# Patient Record
Sex: Female | Born: 2000 | Race: Black or African American | Hispanic: No | Marital: Single | Smoking: Never smoker
Health system: Southern US, Community
[De-identification: ages and names within clinical notes are randomized; demographics above are authoritative.]

## PROBLEM LIST (undated history)

## (undated) DIAGNOSIS — J45909 Unspecified asthma, uncomplicated: Secondary | ICD-10-CM

## (undated) DIAGNOSIS — E039 Hypothyroidism, unspecified: Secondary | ICD-10-CM

## (undated) HISTORY — PX: NO PAST SURGERIES: SHX2092

---
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
## 2018-06-16 ENCOUNTER — Other Ambulatory Visit: Payer: Self-pay

## 2018-06-16 ENCOUNTER — Emergency Department (HOSPITAL_BASED_OUTPATIENT_CLINIC_OR_DEPARTMENT_OTHER)
Admission: EM | Admit: 2018-06-16 | Discharge: 2018-06-16 | Disposition: A | Discharge status: Home / Self Care | Payer: Medicaid Other | Attending: Emergency Medicine | Admitting: Emergency Medicine

## 2018-06-16 ENCOUNTER — Encounter (HOSPITAL_BASED_OUTPATIENT_CLINIC_OR_DEPARTMENT_OTHER): Payer: Self-pay

## 2018-06-16 DIAGNOSIS — N3001 Acute cystitis with hematuria: Secondary | ICD-10-CM | POA: Insufficient documentation

## 2018-06-16 LAB — URINALYSIS, ROUTINE W REFLEX MICROSCOPIC
Bilirubin Urine: NEGATIVE
GLUCOSE, UA: NEGATIVE mg/dL
Ketones, ur: NEGATIVE mg/dL
Nitrite: NEGATIVE
PH: 6.5 (ref 5.0–8.0)
Protein, ur: NEGATIVE mg/dL
Specific Gravity, Urine: 1.015 (ref 1.005–1.030)

## 2018-06-16 LAB — URINALYSIS, MICROSCOPIC (REFLEX)

## 2018-06-16 LAB — PREGNANCY, URINE: Preg Test, Ur: NEGATIVE

## 2018-06-16 MED ORDER — CEPHALEXIN 500 MG PO CAPS: 500.0000 mg | ORAL_CAPSULE | Freq: Three times a day (TID) | ORAL | 0 refills | Status: AC

## 2018-06-16 MED ORDER — CEPHALEXIN 250 MG PO CAPS
500.0000 mg | ORAL_CAPSULE | Freq: Once | ORAL | Status: AC
  Administered 2018-06-16: 500 mg via ORAL
  Filled 2018-06-16: qty 2

## 2018-06-16 NOTE — ED Triage Notes (Addendum)
C/o urinary freq x 6 days-NAD-steady gait

## 2018-06-16 NOTE — ED Notes (Signed)
Increased urinary frequency,  Discomfort w urination  X week

## 2018-06-16 NOTE — Discharge Instructions (Signed)
You can take Tylenol or Ibuprofen as directed for pain. You can alternate Tylenol and Ibuprofen every 4 hours. If you take Tylenol at 1pm, then you can take Ibuprofen at 5pm. Then you can take Tylenol again at 9pm.   Take antibiotics as directed. Please take all of your antibiotics until finished.  Follow-up with [facility].  Return to the emergency department for any worsening pain, fever, nausea or vomiting or any other worsening concerning symptoms.

## 2018-06-16 NOTE — ED Provider Notes (Signed)
[facility] EMERGENCY DEPARTMENT Provider Note   CSN: [id number] Arrival date & time: 06/16/18  2001     History   Chief Complaint Chief Complaint  Patient presents with  . Urinary Frequency    HPI Regina Carr is a 17 y.o. female who presents for evaluation of increased urinary frequency, urinary discomfort x1 week.  Patient reports that she has noticed relax week, she would have frequent urination.  She states that when she was finished urinating, she did have some discomfort and irritation.  She has not noted any blood in the urine.  Patient states that she has been able to tolerate p.o. with any difficulty denies any nausea/vomiting.  Patient denies any fevers, vaginal bleeding, vaginal discharge, lower back pain, abdominal pain.  The history is provided by the patient.    History reviewed. No pertinent past medical history.  There are no active problems to display for this patient.   History reviewed. No pertinent surgical history.   OB History   None      Home Medications    Prior to Admission medications   Medication Sig Start Date End Date Taking? Authorizing Provider  cephALEXin (KEFLEX) 500 MG capsule Take 1 capsule (500 mg total) by mouth 3 (three) times daily for 7 days. 06/16/18 06/23/18  Maxwell Caul, PA-C  naproxen (NAPROSYN) 375 MG tablet Take 1 tablet twice daily with food as needed for chest wall pain. 10/27/17   Molpus, John, MD    Family History No family history on file.  Social History Social History   Tobacco Use  . Smoking status: Never Smoker  . Smokeless tobacco: Never Used  Substance Use Topics  . Alcohol use: Never    Frequency: Never  . Drug use: Never     Allergies   Patient has no known allergies.   Review of Systems Review of Systems  Constitutional: Negative for fever.  Respiratory: Negative for cough and shortness of breath.   Cardiovascular: Negative for chest pain.  Gastrointestinal: Negative  for abdominal pain, nausea and vomiting.  Genitourinary: Positive for dysuria and frequency. Negative for hematuria, vaginal bleeding and vaginal discharge.  Neurological: Negative for headaches.  All other systems reviewed and are negative.    Physical Exam Updated Vital Signs BP 119/78 (BP Location: Right Arm)   Pulse 93   Temp 99 F (37.2 C) (Oral)   Resp 18   Ht 5' (1.524 m)   Wt 77.1 kg   SpO2 100%   BMI 33.20 kg/m   Physical Exam  Constitutional: She appears well-developed and well-nourished.  HENT:  Head: Normocephalic and atraumatic.  Eyes: Conjunctivae and EOM are normal. Right eye exhibits no discharge. Left eye exhibits no discharge. No scleral icterus.  Pulmonary/Chest: Effort normal and breath sounds normal.  Abdominal: Normal appearance and bowel sounds are normal. There is no tenderness. There is no rigidity, no guarding and no CVA tenderness.  Abdomen is soft, non-distended, non-tender. No rigidity, No guarding. No peritoneal signs.  No CVA tenderness bilaterally.  Neurological: She is alert.  Skin: Skin is warm and dry.  Psychiatric: She has a normal mood and affect. Her speech is normal and behavior is normal.  Nursing note and vitals reviewed.    ED Treatments / Results  Labs (all labs ordered are listed, but only abnormal results are displayed) Labs Reviewed  URINALYSIS, ROUTINE W REFLEX MICROSCOPIC - Abnormal; Notable for the following components:      Result Value   APPearance CLOUDY (*)  Hgb urine dipstick TRACE (*)    Leukocytes, UA LARGE (*)    All other components within normal limits  URINALYSIS, MICROSCOPIC (REFLEX) - Abnormal; Notable for the following components:   Bacteria, UA FEW (*)    All other components within normal limits  URINE CULTURE  PREGNANCY, URINE    EKG None  Radiology No results found.  Procedures Procedures (including critical care time)  Medications Ordered in ED Medications  cephALEXin (KEFLEX) capsule  500 mg (500 mg Oral Given 06/16/18 2222)     Initial Impression / Assessment and Plan / ED Course  I have reviewed the triage vital signs and the nursing notes.  Pertinent labs & imaging results that were available during my care of the patient were reviewed by me and considered in my medical decision making (see chart for details).     17 year old female who presents for evaluation of 1 week of increased urinary frequency, dysuria.  No fevers, nausea/vomiting, abdominal pain, lower back pain. Patient is afebrile, non-toxic appearing, sitting comfortably on examination table. Vital signs reviewed and stable.  On exam, abdomen is soft, nontender.  No CVA tenderness.  Consider UTI.  History/physical exam is not concerning for pyelonephritis, kidney stone.  Urine ordered at triage.  Urine pregnancy is negative.  UA shows trace hemoglobin, large leukocytes, pyuria.  There is squamous epithelium so question if this is contaminant.  Urine culture sent.  Given that patient is symptomatic, we will plan to start her on antibiotics. Patient with no known drug allergies. Patient had ample opportunity for questions and discussion. All patient's questions were answered with full understanding. Strict return precautions discussed. Patient expresses understanding and agreement to plan.   Final Clinical Impressions(s) / ED Diagnoses   Final diagnoses:  Acute cystitis with hematuria    ED Discharge Orders         Ordered    cephALEXin (KEFLEX) 500 MG capsule  3 times daily     06/16/18 2212           Maxwell Caul, PA-C 06/16/18 2230    Virgina Norfolk, DO 06/17/18 0109

## 2018-06-19 LAB — URINE CULTURE
Culture: >=100000 — AB
Special Requests: NORMAL

## 2018-06-20 ENCOUNTER — Telehealth: Payer: Self-pay

## 2018-06-20 NOTE — Telephone Encounter (Signed)
Post ED Visit - Positive Culture Follow-up  Culture report reviewed by antimicrobial stewardship pharmacist:  []  Enzo Bi, Pharm.D. []  Celedonio Miyamoto, Pharm.D., BCPS AQ-ID [x]  Garvin Fila, Pharm.D., BCPS []  Georgina Pillion, [address].D., BCPS []  [place], [address].D., BCPS, AAHIVP []  Estella Husk, Pharm.D., BCPS, AAHIVP []  Lysle Pearl, PharmD, BCPS []  Phillips Climes, PharmD, BCPS []  Agapito Games, PharmD, BCPS []  Verlan Friends, PharmD  Positive urine culture Treated with Cephalexin, organism sensitive to the same and no further patient follow-up is required at this time.  Jerry Caras 06/20/2018, 10:18 AM

## 2018-11-13 ENCOUNTER — Emergency Department (HOSPITAL_BASED_OUTPATIENT_CLINIC_OR_DEPARTMENT_OTHER)
Admission: EM | Admit: 2018-11-13 | Discharge: 2018-11-13 | Disposition: A | Discharge status: Home / Self Care | Payer: Medicaid Other | Attending: Emergency Medicine | Admitting: Emergency Medicine

## 2018-11-13 ENCOUNTER — Other Ambulatory Visit: Payer: Self-pay

## 2018-11-13 ENCOUNTER — Encounter (HOSPITAL_BASED_OUTPATIENT_CLINIC_OR_DEPARTMENT_OTHER): Payer: Self-pay | Admitting: Adult Health

## 2018-11-13 DIAGNOSIS — N939 Abnormal uterine and vaginal bleeding, unspecified: Secondary | ICD-10-CM

## 2018-11-13 DIAGNOSIS — R102 Pelvic and perineal pain: Secondary | ICD-10-CM | POA: Insufficient documentation

## 2018-11-13 LAB — URINALYSIS, ROUTINE W REFLEX MICROSCOPIC
Bilirubin Urine: NEGATIVE
Glucose, UA: NEGATIVE mg/dL
Ketones, ur: NEGATIVE mg/dL
Nitrite: NEGATIVE
PROTEIN: 30 mg/dL — AB
SPECIFIC GRAVITY, URINE: 1.02 (ref 1.005–1.030)
pH: 7 (ref 5.0–8.0)

## 2018-11-13 LAB — WET PREP, GENITAL
Sperm: NONE SEEN
Trich, Wet Prep: NONE SEEN
Yeast Wet Prep HPF POC: NONE SEEN

## 2018-11-13 LAB — COMPREHENSIVE METABOLIC PANEL
ALT: 16 U/L (ref 0–44)
AST: 20 U/L (ref 15–41)
Albumin: 4.1 g/dL (ref 3.5–5.0)
Alkaline Phosphatase: 132 U/L — ABNORMAL HIGH (ref 38–126)
Anion gap: 6 (ref 5–15)
BUN: 13 mg/dL (ref 6–20)
CO2: 25 mmol/L (ref 22–32)
Calcium: 9.3 mg/dL (ref 8.9–10.3)
Chloride: 104 mmol/L (ref 98–111)
Creatinine, Ser: 0.68 mg/dL (ref 0.44–1.00)
GFR calc Af Amer: >60 mL/min (ref >60)
GFR calc non Af Amer: >60 mL/min (ref >60)
Glucose, Bld: 93 mg/dL (ref 70–99)
Potassium: 3.8 mmol/L (ref 3.5–5.1)
Sodium: 135 mmol/L (ref 135–145)
Total Bilirubin: 0.6 mg/dL (ref 0.3–1.2)
Total Protein: 7.9 g/dL (ref 6.5–8.1)

## 2018-11-13 LAB — CBC WITH DIFFERENTIAL/PLATELET
Abs Immature Granulocytes: 0.02 10*3/uL (ref 0.00–0.07)
Basophils Absolute: 0 10*3/uL (ref 0.0–0.1)
Basophils Relative: 0 %
Eosinophils Absolute: 0.1 10*3/uL (ref 0.0–0.5)
Eosinophils Relative: 1 %
HCT: 38.8 % (ref 36.0–46.0)
Hemoglobin: 11.9 g/dL — ABNORMAL LOW (ref 12.0–15.0)
Immature Granulocytes: 0 %
Lymphocytes Relative: 35 %
Lymphs Abs: 2.1 10*3/uL (ref 0.7–4.0)
MCH: 25.4 pg — ABNORMAL LOW (ref 26.0–34.0)
MCHC: 30.7 g/dL (ref 30.0–36.0)
MCV: 82.7 fL (ref 80.0–100.0)
Monocytes Absolute: 0.8 10*3/uL (ref 0.1–1.0)
Monocytes Relative: 13 %
Neutro Abs: 3.1 10*3/uL (ref 1.7–7.7)
Neutrophils Relative %: 51 %
Platelets: 394 10*3/uL (ref 150–400)
RBC: 4.69 MIL/uL (ref 3.87–5.11)
RDW: 15.2 % (ref 11.5–15.5)
WBC: 6.1 10*3/uL (ref 4.0–10.5)
nRBC: 0 % (ref 0.0–0.2)

## 2018-11-13 LAB — URINALYSIS, MICROSCOPIC (REFLEX)

## 2018-11-13 LAB — PREGNANCY, URINE: PREG TEST UR: NEGATIVE

## 2018-11-13 MED ORDER — METRONIDAZOLE 500 MG PO TABS: 500.0000 mg | ORAL_TABLET | Freq: Two times a day (BID) | ORAL | 0 refills | Status: DC

## 2018-11-13 NOTE — ED Triage Notes (Signed)
Presents with vaginal bleeding that began two weeks ago, she has been going through 15 pads a day with large clots for the past week. She denies SOB, dizziness.

## 2018-11-13 NOTE — ED Provider Notes (Signed)
[facility] EMERGENCY DEPARTMENT Provider Note   CSN: [id number] Arrival date & time: 11/13/18  1047    History   Chief Complaint Chief Complaint  Patient presents with  . Vaginal Bleeding    HPI Regina Carr is a 18 y.o. female who is previously healthy who presents with a 2-week history of vaginal bleeding and intermittent pelvic cramping.  Patient reports she spotted a week or so before her normal.  And then began having bleeding that is persisted for 2 weeks.  She is feeling 15 pads daily.  She denies any new medications.  She is not on any OCP or hormonal birth control.  She has never had bleeding like this before, although does state that she does have regular periods.  She denies any significant abdominal pain.  She reports she vomited once in melanite last week, but has not since.  She denies any urinary symptoms, chest pain, shortness of breath, lightheadedness, dizziness.  She does not have an OB/GYN.     HPI  History reviewed. No pertinent past medical history.  There are no active problems to display for this patient.   History reviewed. No pertinent surgical history.   OB History   No obstetric history on file.      Home Medications    Prior to Admission medications   Medication Sig Start Date End Date Taking? Authorizing Provider  metroNIDAZOLE (FLAGYL) 500 MG tablet Take 1 tablet (500 mg total) by mouth 2 (two) times daily. 11/13/18   Fergie Sherbert, Waylan Boga, PA-C  naproxen (NAPROSYN) 375 MG tablet Take 1 tablet twice daily with food as needed for chest wall pain. 10/27/17   Molpus, Jonny Ruiz, MD    Family History History reviewed. No pertinent family history.  Social History Social History   Tobacco Use  . Smoking status: Never Smoker  . Smokeless tobacco: Never Used  Substance Use Topics  . Alcohol use: Never    Frequency: Never  . Drug use: Never     Allergies   Patient has no known allergies.   Review of Systems Review of Systems   Constitutional: Negative for chills and fever.  HENT: Negative for facial swelling and sore throat.   Respiratory: Negative for shortness of breath.   Cardiovascular: Negative for chest pain.  Gastrointestinal: Positive for vomiting (x1). Negative for abdominal pain and nausea.  Genitourinary: Positive for vaginal bleeding. Negative for dysuria.  Musculoskeletal: Negative for back pain.  Skin: Negative for rash and wound.  Neurological: Negative for headaches.  Psychiatric/Behavioral: The patient is not nervous/anxious.      Physical Exam Updated Vital Signs BP 129/85   Pulse 99   Temp 98.2 F (36.8 C) (Oral)   Resp 18   Ht 5\' 2"  (1.575 m)   Wt 90.7 kg   SpO2 100%   BMI 36.58 kg/m   Physical Exam Vitals signs and nursing note reviewed. Exam conducted with a chaperone present.  Constitutional:      General: She is not in acute distress.    Appearance: She is well-developed. She is not diaphoretic.  HENT:     Head: Normocephalic and atraumatic.     Mouth/Throat:     Pharynx: No oropharyngeal exudate.  Eyes:     General: No scleral icterus.       Right eye: No discharge.        Left eye: No discharge.     Conjunctiva/sclera: Conjunctivae normal.     Pupils: Pupils are equal, round, and reactive  to light.  Neck:     Musculoskeletal: Normal range of motion and neck supple.     Thyroid: No thyromegaly.  Cardiovascular:     Rate and Rhythm: Normal rate and regular rhythm.     Heart sounds: Normal heart sounds. No murmur. No friction rub. No gallop.   Pulmonary:     Effort: Pulmonary effort is normal. No respiratory distress.     Breath sounds: Normal breath sounds. No stridor. No wheezing or rales.  Abdominal:     General: Bowel sounds are normal. There is no distension.     Palpations: Abdomen is soft.     Tenderness: There is no abdominal tenderness. There is no guarding or rebound.  Genitourinary:    Vagina: Bleeding present.     Cervix: Cervical motion tenderness  (very mild) and cervical bleeding present.     Uterus: Normal.      Adnexa:        Right: No mass or tenderness.         Left: No mass or tenderness.    Lymphadenopathy:     Cervical: No cervical adenopathy.  Skin:    General: Skin is warm and dry.     Coloration: Skin is not pale.     Findings: No rash.  Neurological:     Mental Status: She is alert.     Coordination: Coordination normal.      ED Treatments / Results  Labs (all labs ordered are listed, but only abnormal results are displayed) Labs Reviewed  WET PREP, GENITAL - Abnormal; Notable for the following components:      Result Value   Clue Cells Wet Prep HPF POC PRESENT (*)    WBC, Wet Prep HPF POC FEW (*)    All other components within normal limits  URINALYSIS, ROUTINE W REFLEX MICROSCOPIC - Abnormal; Notable for the following components:   APPearance CLOUDY (*)    Hgb urine dipstick LARGE (*)    Protein, ur 30 (*)    Leukocytes,Ua TRACE (*)    All other components within normal limits  URINALYSIS, MICROSCOPIC (REFLEX) - Abnormal; Notable for the following components:   Bacteria, UA RARE (*)    All other components within normal limits  COMPREHENSIVE METABOLIC PANEL - Abnormal; Notable for the following components:   Alkaline Phosphatase 132 (*)    All other components within normal limits  CBC WITH DIFFERENTIAL/PLATELET - Abnormal; Notable for the following components:   Hemoglobin 11.9 (*)    MCH 25.4 (*)    All other components within normal limits  PREGNANCY, URINE  GC/CHLAMYDIA PROBE AMP ([place]) NOT AT [facility]    EKG None  Radiology No results found.  Procedures Procedures (including critical care time)  Medications Ordered in ED Medications - No data to display   Initial Impression / Assessment and Plan / ED Course  I have reviewed the triage vital signs and the nursing notes.  Pertinent labs & imaging results that were available during my care of the patient were reviewed by me and  considered in my medical decision making (see chart for details).        Patient presenting with 2-week history of vaginal bleeding.  Hemoglobin is 11.9.  Patient has no hemorrhage on pelvic exam.  She has no significant pain.  I discussed patient case with OB/GYN on-call, Dr. Earlene Plater, who did not advise beginning any hormonal treatment at this time.  Patient is advised to follow-up in the office.  Patient  does have clue cells on wet prep and she is given Flagyl.  GC/chlamydia sent and pending.  Return precautions discussed.  Patient understands and agrees with plan.  Patient vitals stable throughout ED course and discharged in satisfactory condition. I discussed patient case with Dr. Denton Lank who guided the patient's management and agrees with plan.   Final Clinical Impressions(s) / ED Diagnoses   Final diagnoses:  Abnormal uterine bleeding    ED Discharge Orders         Ordered    metroNIDAZOLE (FLAGYL) 500 MG tablet  2 times daily     11/13/18 [address] [place], [place] 11/13/18 1418    Cathren Laine, MD 11/13/18 1442

## 2018-11-13 NOTE — ED Notes (Signed)
Pt verbalized understanding of dc instructions.

## 2018-11-13 NOTE — Discharge Instructions (Addendum)
Please follow-up with the women's outpatient clinic or another OB/GYN of your choice for further evaluation and treatment of your vaginal bleeding.  Please return to the emergency department if you develop any new or worsening symptoms including severe lightheadedness or passing out, chest pain, shortness of breath, severe abdominal pain, or any other new or concerning symptoms.

## 2018-11-15 LAB — GC/CHLAMYDIA PROBE AMP (~~LOC~~) NOT AT ARMC
Chlamydia: NEGATIVE
Neisseria Gonorrhea: NEGATIVE

## 2018-11-25 ENCOUNTER — Other Ambulatory Visit: Payer: Self-pay

## 2018-11-25 ENCOUNTER — Ambulatory Visit (INDEPENDENT_AMBULATORY_CARE_PROVIDER_SITE_OTHER): Payer: Medicaid Other | Admitting: Family Medicine

## 2018-11-25 DIAGNOSIS — N970 Female infertility associated with anovulation: Secondary | ICD-10-CM

## 2018-11-25 MED ORDER — NORGESTIMATE-ETH ESTRADIOL 0.25-35 MG-MCG PO TABS: 1.0000 | ORAL_TABLET | Freq: Every day | ORAL | 3 refills | Status: DC

## 2018-11-25 NOTE — Progress Notes (Signed)
TELEHEALTH VIRTUAL GYNECOLOGY VISIT ENCOUNTER NOTE  I connected with Regina Carr on 11/25/18 at  1:15 PM EDT by telephone at home and verified that I am speaking with the correct person using two identifiers.   I discussed the limitations, risks, security and privacy concerns of performing an evaluation and management service by telephone and the availability of in person appointments. I also discussed with the patient that there may be a patient responsible charge related to this service. The patient expressed understanding and agreed to proceed.   History:  Regina Carr is a 18 y.o. G0 female being evaluated today for abnormal uterine bleeding.  Patient was seen in the emergency department for 2 weeks of bleeding.  At that time her hemoglobin was 11.9.  The ED course and labs were all reviewed at today's visit. She denies any abnormal vaginal discharge, pelvic pain or other concerns.  After the ED visit, the patient has continued to have bleeding without any stop.  She uses 8-10 pads per day that are saturated.  No new sexual partners.  Is not currently sexually active.  Did have some irregular periods when first started menses, which improved after being on birth control pills.  Has regular interval periods that traditionally last about 4 days.     No past medical history on file.  Otherwise healthy No past surgical history on file.  No history of surgery  The following portions of the patient's history were reviewed and updated as appropriate: allergies, current medications, past family history, past medical history, past social history, past surgical history and problem list.  Does not smoke  Health Maintenance: No Pap or mammogram indicated  Review of Systems:  Pertinent items noted in HPI and remainder of comprehensive ROS otherwise negative.  Physical Exam:  Physical exam deferred due to nature of the encounter  Labs and Imaging Results for orders placed or performed  during the hospital encounter of 11/13/18 (from the past 336 hour(s))  GC/Chlamydia probe amp   Collection Time: 11/13/18 12:00 AM  Result Value Ref Range   Chlamydia Negative    Neisseria gonorrhea Negative   Pregnancy, urine   Collection Time: 11/13/18 11:14 AM  Result Value Ref Range   Preg Test, Ur NEGATIVE NEGATIVE  Urinalysis, Routine w reflex microscopic   Collection Time: 11/13/18 11:14 AM  Result Value Ref Range   Color, Urine YELLOW YELLOW   APPearance CLOUDY (A) CLEAR   Specific Gravity, Urine 1.020 1.005 - 1.030   pH 7.0 5.0 - 8.0   Glucose, UA NEGATIVE NEGATIVE mg/dL   Hgb urine dipstick LARGE (A) NEGATIVE   Bilirubin Urine NEGATIVE NEGATIVE   Ketones, ur NEGATIVE NEGATIVE mg/dL   Protein, ur 30 (A) NEGATIVE mg/dL   Nitrite NEGATIVE NEGATIVE   Leukocytes,Ua TRACE (A) NEGATIVE  Urinalysis, Microscopic (reflex)   Collection Time: 11/13/18 11:14 AM  Result Value Ref Range   RBC / HPF >50 0 - 5 RBC/hpf   WBC, UA 0-5 0 - 5 WBC/hpf   Bacteria, UA RARE (A) NONE SEEN   Squamous Epithelial / LPF 0-5 0 - 5  Comprehensive metabolic panel   Collection Time: 11/13/18 11:51 AM  Result Value Ref Range   Sodium 135 135 - 145 mmol/L   Potassium 3.8 3.5 - 5.1 mmol/L   Chloride 104 98 - 111 mmol/L   CO2 25 22 - 32 mmol/L   Glucose, Bld 93 70 - 99 mg/dL   BUN 13 6 - 20 mg/dL  Creatinine, Ser 0.68 0.44 - 1.00 mg/dL   Calcium 9.3 8.9 - 30.0 mg/dL   Total Protein 7.9 6.5 - 8.1 g/dL   Albumin 4.1 3.5 - 5.0 g/dL   AST 20 15 - 41 U/L   ALT 16 0 - 44 U/L   Alkaline Phosphatase 132 (H) 38 - 126 U/L   Total Bilirubin 0.6 0.3 - 1.2 mg/dL   GFR calc non Af Amer >60 >60 mL/min   GFR calc Af Amer >60 >60 mL/min   Anion gap 6 5 - 15  CBC with Differential   Collection Time: 11/13/18 11:51 AM  Result Value Ref Range   WBC 6.1 4.0 - 10.5 K/uL   RBC 4.69 3.87 - 5.11 MIL/uL   Hemoglobin 11.9 (L) 12.0 - 15.0 g/dL   HCT 92.3 30.0 - 76.2 %   MCV 82.7 80.0 - 100.0 fL   MCH 25.4 (L)  26.0 - 34.0 pg   MCHC 30.7 30.0 - 36.0 g/dL   RDW 26.3 33.5 - 45.6 %   Platelets 394 150 - 400 K/uL   nRBC 0.0 0.0 - 0.2 %   Neutrophils Relative % 51 %   Neutro Abs 3.1 1.7 - 7.7 K/uL   Lymphocytes Relative 35 %   Lymphs Abs 2.1 0.7 - 4.0 K/uL   Monocytes Relative 13 %   Monocytes Absolute 0.8 0.1 - 1.0 K/uL   Eosinophils Relative 1 %   Eosinophils Absolute 0.1 0.0 - 0.5 K/uL   Basophils Relative 0 %   Basophils Absolute 0.0 0.0 - 0.1 K/uL   Immature Granulocytes 0 %   Abs Immature Granulocytes 0.02 0.00 - 0.07 K/uL  Wet prep, genital   Collection Time: 11/13/18 12:04 PM  Result Value Ref Range   Yeast Wet Prep HPF POC NONE SEEN NONE SEEN   Trich, Wet Prep NONE SEEN NONE SEEN   Clue Cells Wet Prep HPF POC PRESENT (A) NONE SEEN   WBC, Wet Prep HPF POC FEW (A) NONE SEEN   Sperm NONE SEEN    No results found.    Assessment and Plan:     1. Anovulatory bleeding OCP taper.  Gust how to use and possibility of becoming nauseated on higher doses of estrogen.  Instructed patient to call with any questions or concerns.  Patient does have access to my chart and instructions for OCP taper was sent to the patient.  We will follow-up with the patient via phone next week and schedule a follow-up in 3-4 months.       I discussed the assessment and treatment plan with the patient. The patient was provided an opportunity to ask questions and all were answered. The patient agreed with the plan and demonstrated an understanding of the instructions.   The patient was advised to call back or seek an in-person evaluation/go to the ED if the symptoms worsen or if the condition fails to improve as anticipated.  I provided 15 minutes of non-face-to-face time during this encounter.   Levie Heritage, DO [facility] [employer], [facility]

## 2018-12-14 MED ORDER — MEGESTROL ACETATE 40 MG PO TABS: 40.0000 mg | ORAL_TABLET | Freq: Two times a day (BID) | ORAL | 0 refills | Status: DC

## 2019-01-04 ENCOUNTER — Encounter (HOSPITAL_BASED_OUTPATIENT_CLINIC_OR_DEPARTMENT_OTHER): Payer: Self-pay | Admitting: Emergency Medicine

## 2019-01-04 ENCOUNTER — Emergency Department (HOSPITAL_BASED_OUTPATIENT_CLINIC_OR_DEPARTMENT_OTHER): Payer: No Typology Code available for payment source

## 2019-01-04 ENCOUNTER — Emergency Department (HOSPITAL_BASED_OUTPATIENT_CLINIC_OR_DEPARTMENT_OTHER)
Admission: EM | Admit: 2019-01-04 | Discharge: 2019-01-05 | Disposition: A | Discharge status: Home / Self Care | Payer: No Typology Code available for payment source | Attending: Emergency Medicine | Admitting: Emergency Medicine

## 2019-01-04 ENCOUNTER — Other Ambulatory Visit: Payer: Self-pay

## 2019-01-04 DIAGNOSIS — M545 Low back pain: Secondary | ICD-10-CM | POA: Diagnosis not present

## 2019-01-04 DIAGNOSIS — Z79899 Other long term (current) drug therapy: Secondary | ICD-10-CM | POA: Insufficient documentation

## 2019-01-04 DIAGNOSIS — M25531 Pain in right wrist: Secondary | ICD-10-CM | POA: Diagnosis not present

## 2019-01-04 DIAGNOSIS — J45909 Unspecified asthma, uncomplicated: Secondary | ICD-10-CM | POA: Insufficient documentation

## 2019-01-04 DIAGNOSIS — Y999 Unspecified external cause status: Secondary | ICD-10-CM | POA: Insufficient documentation

## 2019-01-04 DIAGNOSIS — Y939 Activity, unspecified: Secondary | ICD-10-CM | POA: Insufficient documentation

## 2019-01-04 DIAGNOSIS — Y92410 Unspecified street and highway as the place of occurrence of the external cause: Secondary | ICD-10-CM | POA: Diagnosis not present

## 2019-01-04 HISTORY — DX: Unspecified asthma, uncomplicated: J45.909

## 2019-01-04 MED ORDER — IBUPROFEN 600 MG PO TABS: 600.0000 mg | ORAL_TABLET | Freq: Four times a day (QID) | ORAL | 0 refills | Status: DC | PRN

## 2019-01-04 MED ORDER — IBUPROFEN 800 MG PO TABS
800.0000 mg | ORAL_TABLET | Freq: Once | ORAL | Status: AC
  Administered 2019-01-04: 800 mg via ORAL
  Filled 2019-01-04: qty 1
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
## 2019-01-04 NOTE — Discharge Instructions (Addendum)
Your Xrays are negative. As we discussed, a fracture may not appear on Xray right away. You should wear the splint and followup with the hand doctor. Use ice, motrin, elevation. Return to the ED with new or worsening symptoms.

## 2019-01-11 MED ORDER — MEGESTROL ACETATE 40 MG PO TABS: 40.0000 mg | ORAL_TABLET | Freq: Two times a day (BID) | ORAL | 0 refills | Status: AC

## 2019-02-24 ENCOUNTER — Other Ambulatory Visit: Payer: Self-pay

## 2019-02-24 ENCOUNTER — Ambulatory Visit: Payer: Medicaid Other | Admitting: Family Medicine

## 2019-03-26 ENCOUNTER — Encounter (HOSPITAL_COMMUNITY): Payer: Self-pay | Admitting: Emergency Medicine

## 2019-03-26 ENCOUNTER — Emergency Department (HOSPITAL_COMMUNITY)
Admission: EM | Admit: 2019-03-26 | Discharge: 2019-03-26 | Disposition: A | Discharge status: Home / Self Care | Payer: Medicaid Other | Attending: Emergency Medicine | Admitting: Emergency Medicine

## 2019-03-26 DIAGNOSIS — Y939 Activity, unspecified: Secondary | ICD-10-CM | POA: Insufficient documentation

## 2019-03-26 DIAGNOSIS — W01110A Fall on same level from slipping, tripping and stumbling with subsequent striking against sharp glass, initial encounter: Secondary | ICD-10-CM | POA: Insufficient documentation

## 2019-03-26 DIAGNOSIS — S51822A Laceration with foreign body of left forearm, initial encounter: Secondary | ICD-10-CM | POA: Insufficient documentation

## 2019-03-26 DIAGNOSIS — S41112A Laceration without foreign body of left upper arm, initial encounter: Secondary | ICD-10-CM

## 2019-03-26 DIAGNOSIS — Y999 Unspecified external cause status: Secondary | ICD-10-CM | POA: Insufficient documentation

## 2019-03-26 DIAGNOSIS — Y929 Unspecified place or not applicable: Secondary | ICD-10-CM | POA: Insufficient documentation

## 2019-03-26 DIAGNOSIS — J45909 Unspecified asthma, uncomplicated: Secondary | ICD-10-CM | POA: Insufficient documentation
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
## 2019-03-26 NOTE — Discharge Instructions (Signed)
Return for redness, drainage, fever.    

## 2019-04-07 ENCOUNTER — Emergency Department (HOSPITAL_BASED_OUTPATIENT_CLINIC_OR_DEPARTMENT_OTHER)
Admission: EM | Admit: 2019-04-07 | Discharge: 2019-04-07 | Disposition: A | Discharge status: Home / Self Care | Payer: Self-pay | Attending: Emergency Medicine | Admitting: Emergency Medicine

## 2019-04-07 ENCOUNTER — Encounter (HOSPITAL_BASED_OUTPATIENT_CLINIC_OR_DEPARTMENT_OTHER): Payer: Self-pay | Admitting: *Deleted

## 2019-04-07 ENCOUNTER — Other Ambulatory Visit: Payer: Self-pay

## 2019-04-07 DIAGNOSIS — Z4802 Encounter for removal of sutures: Secondary | ICD-10-CM | POA: Insufficient documentation

## 2019-04-07 NOTE — ED Triage Notes (Signed)
Here for suture removal from her left arm. Well healed.
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
## 2019-04-07 NOTE — Discharge Instructions (Addendum)
You were evaluated in the Emergency Department and after careful evaluation, we did not find any emergent condition requiring admission or further testing in the hospital.  Your wounds are healing well and we removed her sutures today.  We recommend Neosporin to aid with scarring.  Please return to the Emergency Department if you experience any worsening of your condition.  We encourage you to follow up with a primary care provider.  Thank you for allowing [place] to be a part of your care.

## 2019-04-07 NOTE — ED Notes (Signed)
Pt here for suture removal, intact wounds , no bleeding nor signs of infection.

## 2019-10-06 ENCOUNTER — Ambulatory Visit: Payer: Medicaid Other | Admitting: Family Medicine

## 2019-10-27 ENCOUNTER — Other Ambulatory Visit: Payer: Self-pay

## 2019-10-27 ENCOUNTER — Encounter: Payer: Self-pay | Admitting: Family Medicine

## 2019-10-27 ENCOUNTER — Ambulatory Visit (INDEPENDENT_AMBULATORY_CARE_PROVIDER_SITE_OTHER): Payer: Self-pay | Admitting: Family Medicine

## 2019-10-27 VITALS — BP 120/76 | HR 89 | Ht 61.0 in | Wt 180.0 lb

## 2019-10-27 DIAGNOSIS — N939 Abnormal uterine and vaginal bleeding, unspecified: Secondary | ICD-10-CM

## 2019-10-27 MED ORDER — NORETHIN ACE-ETH ESTRAD-FE 1-20 MG-MCG(24) PO TABS: 1.0000 | ORAL_TABLET | Freq: Every day | ORAL | 3 refills | Status: DC

## 2019-10-27 NOTE — Progress Notes (Signed)
   Subjective:    Patient ID: Regina Carr, female    DOB: [birth year], 19 y.o.   MRN: [id number]  HPI Patient seen for menorrhagia. Having regular periods, but periods are prolonged - bleeding 7-10 days usually. Possibly as long as 14 days. This was regardless of being on OCP (was on sprintec).   Review of Systems     Objective:   Physical Exam Vitals reviewed.  Constitutional:      Appearance: Normal appearance.  Cardiovascular:     Rate and Rhythm: Normal rate and regular rhythm.     Pulses: Normal pulses.  Pulmonary:     Effort: Pulmonary effort is normal.     Breath sounds: Normal breath sounds.  Abdominal:     General: Abdomen is flat. Bowel sounds are normal.     Palpations: Abdomen is soft.  Skin:    Capillary Refill: Capillary refill takes less than 2 seconds.  Neurological:     Mental Status: She is alert.  Psychiatric:        Mood and Affect: Mood normal.        Behavior: Behavior normal.        Thought Content: Thought content normal.        Judgment: Judgment normal.        Assessment & Plan:  1. Abnormal uterine bleeding (AUB) Change to loestrin. Will see if this improves her bleeding profile.

## 2020-01-01 ENCOUNTER — Encounter (HOSPITAL_BASED_OUTPATIENT_CLINIC_OR_DEPARTMENT_OTHER): Payer: Self-pay

## 2020-01-01 ENCOUNTER — Other Ambulatory Visit: Payer: Self-pay

## 2020-01-01 ENCOUNTER — Emergency Department (HOSPITAL_BASED_OUTPATIENT_CLINIC_OR_DEPARTMENT_OTHER)
Admission: EM | Admit: 2020-01-01 | Discharge: 2020-01-01 | Disposition: A | Discharge status: Home / Self Care | Payer: Medicaid Other | Attending: Emergency Medicine | Admitting: Emergency Medicine

## 2020-01-01 ENCOUNTER — Telehealth (HOSPITAL_BASED_OUTPATIENT_CLINIC_OR_DEPARTMENT_OTHER): Payer: Self-pay | Admitting: Emergency Medicine

## 2020-01-01 DIAGNOSIS — J45909 Unspecified asthma, uncomplicated: Secondary | ICD-10-CM | POA: Insufficient documentation

## 2020-01-01 DIAGNOSIS — Z20822 Contact with and (suspected) exposure to covid-19: Secondary | ICD-10-CM | POA: Insufficient documentation

## 2020-01-01 DIAGNOSIS — Z79899 Other long term (current) drug therapy: Secondary | ICD-10-CM | POA: Insufficient documentation

## 2020-01-01 DIAGNOSIS — J069 Acute upper respiratory infection, unspecified: Secondary | ICD-10-CM | POA: Insufficient documentation

## 2020-01-01 LAB — RESPIRATORY PANEL BY RT PCR (FLU A&B, COVID)
Influenza A by PCR: NEGATIVE
Influenza B by PCR: NEGATIVE
SARS Coronavirus 2 by RT PCR: NEGATIVE

## 2020-01-01 LAB — GROUP A STREP BY PCR: Group A Strep by PCR: NOT DETECTED

## 2020-01-01 NOTE — ED Triage Notes (Signed)
Pt arrives with c/o sore throat, cough, headaches, and raspy voice since Monday.

## 2020-01-01 NOTE — ED Provider Notes (Signed)
[facility] EMERGENCY DEPARTMENT Provider Note   CSN: [id number] Arrival date & time: 01/01/20  1549     History Chief Complaint  Patient presents with  . Sore Throat    Regina Carr is a 19 y.o. female.  19 year old female with complaint of nonproductive cough, raspy voice and sore throat for the past week.  Denies any body aches, diarrhea, shortness of breath.  No known exposure to COVID-19.  No other complaints or concerns.  Regina Carr was evaluated in Emergency Department on 01/01/2020 for the symptoms described in the history of present illness. She was evaluated in the context of the global COVID-19 pandemic, which necessitated consideration that the patient might be at risk for infection with the SARS-CoV-2 virus that causes COVID-19. Institutional protocols and algorithms that pertain to the evaluation of patients at risk for COVID-19 are in a state of rapid change based on information released by regulatory bodies including the CDC and federal and state organizations. These policies and algorithms were followed during the patient's care in the ED.         Past Medical History:  Diagnosis Date  . Asthma     There are no problems to display for this patient.   History reviewed. No pertinent surgical history.   OB History   No obstetric history on file.     No family history on file.  Social History   Tobacco Use  . Smoking status: Never Smoker  . Smokeless tobacco: Never Used  Substance Use Topics  . Alcohol use: Never  . Drug use: Never    Home Medications Prior to Admission medications   Medication Sig Start Date End Date Taking? Authorizing Provider  Norethindrone Acetate-Ethinyl Estrad-FE (LOESTRIN 24 FE) 1-20 MG-MCG(24) tablet Take 1 tablet by mouth daily. 10/27/19   Truett Mainland, DO    Allergies    Patient has no known allergies.  Review of Systems   Review of Systems  Constitutional: Negative for chills, diaphoresis  and fever.  HENT: Positive for sore throat. Negative for congestion, ear pain, sinus pressure, sinus pain and sneezing.   Eyes: Negative for redness.  Respiratory: Positive for cough. Negative for shortness of breath.   Musculoskeletal: Negative for arthralgias and myalgias.  Skin: Negative for rash and wound.  Hematological: Negative for adenopathy.  Psychiatric/Behavioral: Negative for confusion.  All other systems reviewed and are negative.   Physical Exam Updated Vital Signs BP 121/78 (BP Location: Left Arm)   Pulse 88   Temp 98.3 F (36.8 C)   Resp 18   Ht 5\' 2"  (1.575 m)   Wt 78 kg   LMP 12/22/2019   SpO2 99%   BMI 31.46 kg/m   Physical Exam Vitals and nursing note reviewed.  Constitutional:      General: She is not in acute distress.    Appearance: She is well-developed. She is not diaphoretic.  HENT:     Head: Normocephalic and atraumatic.     Right Ear: Tympanic membrane and ear canal normal.     Left Ear: There is impacted cerumen.     Nose: No congestion.     Mouth/Throat:     Mouth: Mucous membranes are moist.     Pharynx: No pharyngeal swelling, oropharyngeal exudate, posterior oropharyngeal erythema or uvula swelling.     Tonsils: No tonsillar exudate. 0 on the right. 0 on the left.  Eyes:     Conjunctiva/sclera: Conjunctivae normal.  Cardiovascular:     Rate and  Rhythm: Normal rate and regular rhythm.  Pulmonary:     Effort: Pulmonary effort is normal.  Musculoskeletal:     Cervical back: Neck supple.  Lymphadenopathy:     Cervical: No cervical adenopathy.  Skin:    General: Skin is warm and dry.     Findings: No erythema or rash.  Neurological:     Mental Status: She is alert and oriented to person, place, and time.  Psychiatric:        Behavior: Behavior normal.     ED Results / Procedures / Treatments   Labs (all labs ordered are listed, but only abnormal results are displayed) Labs Reviewed  GROUP A STREP BY PCR  RESPIRATORY PANEL BY  RT PCR (FLU A&B, COVID)    EKG None  Radiology No results found.  Procedures Procedures (including critical care time)  Medications Ordered in ED Medications - No data to display  ED Course  I have reviewed the triage vital signs and the nursing notes.  Pertinent labs & imaging results that were available during my care of the patient were reviewed by me and considered in my medical decision making (see chart for details).  Clinical Course as of Jan 01 1712  Sun Jan 01, 2020  24 19 year old female with URI symptoms x1 week.  On exam, well-appearing, does not have tonsils, no pharyngeal erythema or exudate, no tender lymphadenopathy.  Rapid strep is negative.  Covid testing sent out.  Recommend symptomatic treatment, given work note to return to work if Regina Carr test is negative, if positive follow protocol.   [LM]    Clinical Course User Index [LM] Alden Hipp   MDM Rules/Calculators/A&P                      Final Clinical Impression(s) / ED Diagnoses Final diagnoses:  Viral upper respiratory tract infection    Rx / DC Orders ED Discharge Orders    None       Jeannie Fend, PA-C 01/01/20 1713    Geoffery Lyons, MD 01/01/20 2306

## 2020-09-20 ENCOUNTER — Ambulatory Visit: Payer: Medicaid Other | Admitting: Family Medicine

## 2020-10-18 ENCOUNTER — Ambulatory Visit: Payer: Medicaid Other | Admitting: Family Medicine

## 2020-10-24 ENCOUNTER — Ambulatory Visit: Payer: Medicaid Other | Admitting: Family Medicine

## 2020-11-21 ENCOUNTER — Other Ambulatory Visit: Payer: Self-pay

## 2020-11-21 ENCOUNTER — Ambulatory Visit (INDEPENDENT_AMBULATORY_CARE_PROVIDER_SITE_OTHER): Payer: 59 | Admitting: Family Medicine

## 2020-11-21 ENCOUNTER — Other Ambulatory Visit (HOSPITAL_COMMUNITY)
Admission: RE | Admit: 2020-11-21 | Discharge: 2020-11-21 | Disposition: A | Discharge status: Home / Self Care | Payer: 59 | Source: Ambulatory Visit | Attending: Family Medicine | Admitting: Family Medicine

## 2020-11-21 ENCOUNTER — Encounter: Payer: Self-pay | Admitting: Family Medicine

## 2020-11-21 VITALS — BP 135/79 | HR 87 | Wt 201.0 lb

## 2020-11-21 DIAGNOSIS — Z01419 Encounter for gynecological examination (general) (routine) without abnormal findings: Secondary | ICD-10-CM | POA: Insufficient documentation

## 2020-11-21 DIAGNOSIS — N939 Abnormal uterine and vaginal bleeding, unspecified: Secondary | ICD-10-CM

## 2020-11-21 MED ORDER — NORELGESTROMIN-ETH ESTRADIOL 150-35 MCG/24HR TD PTWK: 1.0000 | MEDICATED_PATCH | TRANSDERMAL | 3 refills | Status: DC

## 2020-11-21 NOTE — Progress Notes (Signed)
GYNECOLOGY ANNUAL PREVENTATIVE CARE ENCOUNTER NOTE  Subjective:   Regina Carr is a 20 y.o. G0P0000 female here for a routine annual gynecologic exam.  Current complaints: prolonged period for about 21 days. Has had mostly normal periods, but also having some irregular bleeding in the past.   Denies abnormal vaginal bleeding, discharge, pelvic pain, problems with intercourse or other gynecologic concerns.    Gynecologic History Patient's last menstrual period was 11/02/2020. Patient is sexually active - has casual partner Contraception: condoms Last Pap: none.  Last mammogram: N/A.  Obstetric History OB History  Gravida Para Term Preterm AB Living  0 0 0 0 0 0  SAB IAB Ectopic Multiple Live Births  0 0 0 0 0    Past Medical History:  Diagnosis Date  . Asthma     History reviewed. No pertinent surgical history.  Current Outpatient Medications on File Prior to Visit  Medication Sig Dispense Refill  . Norethindrone Acetate-Ethinyl Estrad-FE (LOESTRIN 24 FE) 1-20 MG-MCG(24) tablet Take 1 tablet by mouth daily. (Patient not taking: Reported on 11/21/2020) 3 Package 3   No current facility-administered medications on file prior to visit.    No Known Allergies  Social History   Socioeconomic History  . Marital status: Single    Spouse name: Not on file  . Number of children: Not on file  . Years of education: Not on file  . Highest education level: Not on file  Occupational History  . Not on file  Tobacco Use  . Smoking status: Never Smoker  . Smokeless tobacco: Never Used  Substance and Sexual Activity  . Alcohol use: Never  . Drug use: Never  . Sexual activity: Yes    Birth control/protection: None  Other Topics Concern  . Not on file  Social History Narrative  . Not on file   Social Determinants of Health   Financial Resource Strain: Not on file  Food Insecurity: Not on file  Transportation Needs: Not on file  Physical Activity: Not on file   Stress: Not on file  Social Connections: Not on file  Intimate Partner Violence: Not on file    History reviewed. No pertinent family history.  The following portions of the patient's history were reviewed and updated as appropriate: allergies, current medications, past family history, past medical history, past social history, past surgical history and problem list.  Review of Systems Pertinent items are noted in HPI.   Objective:  BP 135/79   Pulse 87   Wt 201 lb (91.2 kg)   LMP 11/02/2020   BMI 36.76 kg/m  Wt Readings from Last 3 Encounters:  11/21/20 201 lb (91.2 kg)  01/01/20 172 lb (78 kg)  10/27/19 180 lb (81.6 kg)     Chaperone present during exam  CONSTITUTIONAL: Well-developed, well-nourished female in no acute distress.  HENT:  Normocephalic, atraumatic, External right and left ear normal. Oropharynx is clear and moist EYES: Conjunctivae and EOM are normal. Pupils are equal, round, and reactive to light. No scleral icterus.  NECK: Normal range of motion, supple, no masses.  Normal thyroid.   CARDIOVASCULAR: Normal heart rate noted, regular rhythm RESPIRATORY: Clear to auscultation bilaterally. Effort and breath sounds normal, no problems with respiration noted. BREASTS: Symmetric in size. No masses, skin changes, nipple drainage, or lymphadenopathy. ABDOMEN: Soft, normal bowel sounds, no distention noted.  No tenderness, rebound or guarding.  PELVIC: Normal appearing external genitalia; normal appearing vaginal mucosa and cervix.  No abnormal discharge noted.  Normal  uterine size, no other palpable masses, no uterine or adnexal tenderness. MUSCULOSKELETAL: Normal range of motion. No tenderness.  No cyanosis, clubbing, or edema.  2+ distal pulses. SKIN: Skin is warm and dry. No rash noted. Not diaphoretic. No erythema. No pallor. NEUROLOGIC: Alert and oriented to person, place, and time. Normal reflexes, muscle tone coordination. No cranial nerve deficit  noted. PSYCHIATRIC: Normal mood and affect. Normal behavior. Normal judgment and thought content.  Assessment:  Annual gynecologic examination with pap smear   Plan:  1. Well Woman Exam Will follow up results of pap smear and manage accordingly. STD testing discussed. Patient requested testing - Cytology - PAP( [place]) - HIV antibody (with reflex) - RPR - Hepatitis C Antibody - Hepatitis B Surface AntiGEN  2. Abnormal uterine bleeding (AUB) Discussed contraception. Would like to start patches. Discussed how to use. F/u in 3 months. - US PELVIC COMPLETE WITH TRANSVAGINAL; Future   Routine preventative health maintenance measures emphasized. Please refer to After Visit Summary for other counseling recommendations.    Candelaria Celeste, DO [facility]

## 2020-11-22 LAB — RPR: RPR Ser Ql: NONREACTIVE

## 2020-11-22 LAB — HEPATITIS B SURFACE ANTIGEN: Hepatitis B Surface Ag: NEGATIVE

## 2020-11-22 LAB — HIV ANTIBODY (ROUTINE TESTING W REFLEX): HIV Screen 4th Generation wRfx: NONREACTIVE

## 2020-11-22 LAB — HEPATITIS C ANTIBODY: Hep C Virus Ab: <0.1 s/co ratio (ref 0.0–0.9)

## 2020-11-23 ENCOUNTER — Other Ambulatory Visit: Payer: Self-pay

## 2020-11-23 ENCOUNTER — Ambulatory Visit (HOSPITAL_BASED_OUTPATIENT_CLINIC_OR_DEPARTMENT_OTHER)
Admission: RE | Admit: 2020-11-23 | Discharge: 2020-11-23 | Disposition: A | Discharge status: Home / Self Care | Payer: 59 | Source: Ambulatory Visit | Attending: Family Medicine | Admitting: Family Medicine

## 2020-11-23 DIAGNOSIS — N939 Abnormal uterine and vaginal bleeding, unspecified: Secondary | ICD-10-CM | POA: Diagnosis not present

## 2020-11-23 LAB — CYTOLOGY - PAP
Chlamydia: NEGATIVE
Comment: NEGATIVE
Comment: NORMAL
Diagnosis: NEGATIVE
Neisseria Gonorrhea: NEGATIVE

## 2020-11-26 ENCOUNTER — Other Ambulatory Visit (HOSPITAL_BASED_OUTPATIENT_CLINIC_OR_DEPARTMENT_OTHER): Payer: 59

## 2020-12-19 DIAGNOSIS — N939 Abnormal uterine and vaginal bleeding, unspecified: Secondary | ICD-10-CM

## 2020-12-24 MED ORDER — TRAMADOL HCL 50 MG PO TABS: 50.0000 mg | ORAL_TABLET | Freq: Four times a day (QID) | ORAL | 0 refills | Status: DC | PRN

## 2020-12-26 MED ORDER — MEDROXYPROGESTERONE ACETATE 10 MG PO TABS: 20.0000 mg | ORAL_TABLET | Freq: Every day | ORAL | 0 refills | Status: DC

## 2020-12-26 NOTE — Addendum Note (Signed)
Addended by: Levie Heritage on: 12/26/2020 03:50 PM   Modules accepted: Orders

## 2020-12-28 NOTE — Addendum Note (Signed)
Addended by: Levie Heritage on: 12/28/2020 09:52 AM   Modules accepted: Orders

## 2020-12-31 ENCOUNTER — Other Ambulatory Visit: Payer: Self-pay

## 2020-12-31 ENCOUNTER — Ambulatory Visit (HOSPITAL_BASED_OUTPATIENT_CLINIC_OR_DEPARTMENT_OTHER)
Admission: RE | Admit: 2020-12-31 | Discharge: 2020-12-31 | Disposition: A | Discharge status: Home / Self Care | Payer: 59 | Source: Ambulatory Visit | Attending: Family Medicine | Admitting: Family Medicine

## 2020-12-31 DIAGNOSIS — N939 Abnormal uterine and vaginal bleeding, unspecified: Secondary | ICD-10-CM | POA: Diagnosis present

## 2021-01-01 ENCOUNTER — Ambulatory Visit (HOSPITAL_BASED_OUTPATIENT_CLINIC_OR_DEPARTMENT_OTHER): Payer: 59

## 2021-01-17 ENCOUNTER — Other Ambulatory Visit: Payer: Self-pay | Admitting: Family Medicine

## 2021-01-31 ENCOUNTER — Ambulatory Visit: Payer: 59 | Admitting: Family Medicine

## 2021-02-02 ENCOUNTER — Other Ambulatory Visit: Payer: Self-pay | Admitting: Family Medicine

## 2021-05-31 ENCOUNTER — Other Ambulatory Visit: Payer: Self-pay

## 2021-05-31 ENCOUNTER — Encounter: Payer: Self-pay | Admitting: Family Medicine

## 2021-05-31 ENCOUNTER — Ambulatory Visit (INDEPENDENT_AMBULATORY_CARE_PROVIDER_SITE_OTHER): Payer: 59 | Admitting: Family Medicine

## 2021-05-31 VITALS — BP 126/83 | HR 83 | Ht 60.0 in

## 2021-05-31 DIAGNOSIS — N939 Abnormal uterine and vaginal bleeding, unspecified: Secondary | ICD-10-CM

## 2021-05-31 MED ORDER — IBUPROFEN 800 MG PO TABS: 800.0000 mg | ORAL_TABLET | Freq: Three times a day (TID) | ORAL | 12 refills | Status: DC | PRN

## 2021-05-31 MED ORDER — NORETHIN ACE-ETH ESTRAD-FE 1-20 MG-MCG(24) PO TABS: 1.0000 | ORAL_TABLET | Freq: Every day | ORAL | 3 refills | Status: DC

## 2021-05-31 NOTE — Progress Notes (Signed)
   Subjective:    Patient ID: Regina Carr, female    DOB: 05-25-2001, 20 y.o.   MRN: [id number]  HPI Patient seen for contraception management. She was on the patch, which was started in march. She had used it for a month, but had a lot of cramping during her next period so she stopped using the patches.  Her periods remain regular, but has been having intermittent spotting over the past month.  Is currently on her period with menorrhagia.   Review of Systems     Objective:   Physical Exam Vitals reviewed.  Constitutional:      Appearance: Normal appearance.  Cardiovascular:     Rate and Rhythm: Normal rate and regular rhythm.     Pulses: Normal pulses.     Heart sounds: Normal heart sounds.  Skin:    Capillary Refill: Capillary refill takes less than 2 seconds.  Neurological:     General: No focal deficit present.     Mental Status: She is alert.  Psychiatric:        Mood and Affect: Mood normal.        Behavior: Behavior normal.        Thought Content: Thought content normal.      Assessment & Plan:  1. Abnormal uterine bleeding (AUB) Start OCPs. Should improve menorrhagia. F/u in 3 months

## 2021-05-31 NOTE — Progress Notes (Signed)
Patient hasnt been using patch due to cramping. Patient has had bleeding episode that started 05-28-21. Had had some days of spotting before her period started.  Patient complaining of heavy bleeding for last 48 hours - having to change pad every 1-2 hours. Armandina Stammer RN

## 2021-06-14 IMAGING — US US TRANSVAGINAL NON-OB
1 series · 14 of 25 positions shown · non-contrast
Comparison: 11/23/2020

CLINICAL DATA: Abnormal uterine bleeding

EXAM:
ULTRASOUND PELVIS TRANSVAGINAL
TECHNIQUE: Transvaginal ultrasound examination of the pelvis was performed
including evaluation of the uterus, ovaries, adnexal regions, and
pelvic cul-de-sac.

[Series 1: us transvaginal non-ob · 14 of 41 slices shown]
[im 1/41]
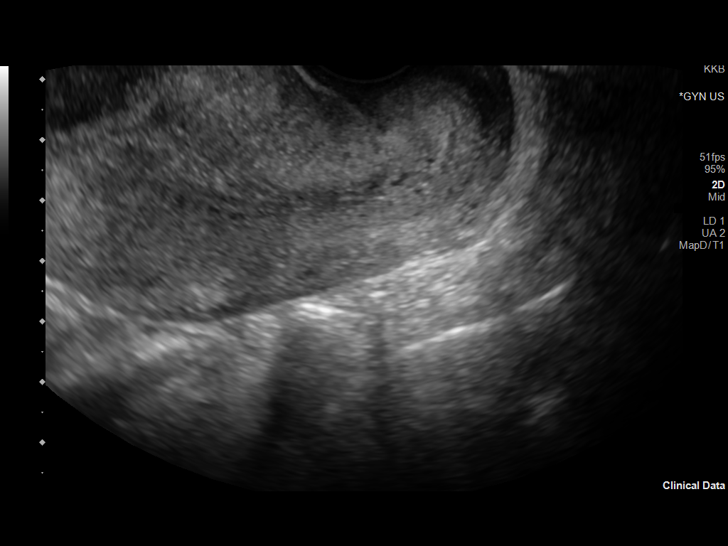
[im 4/41]
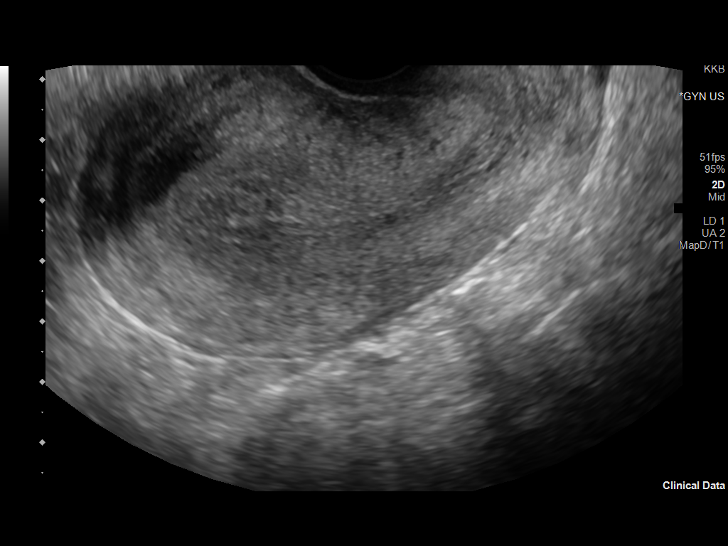
[im 7/41]
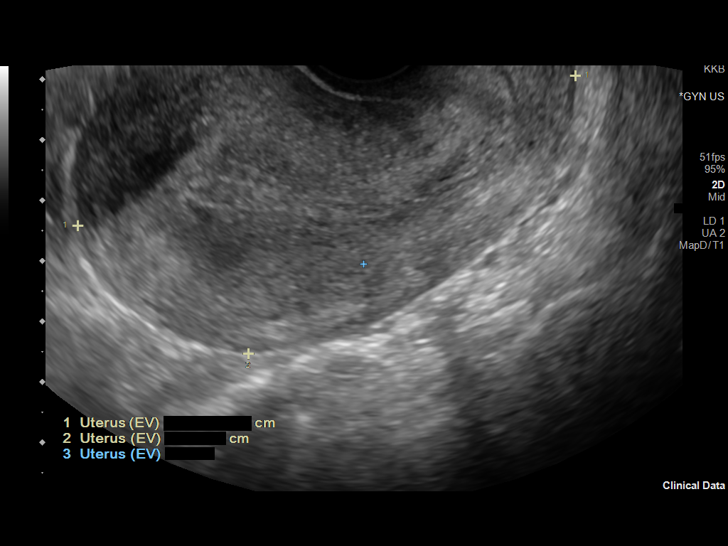
[im 11/41]
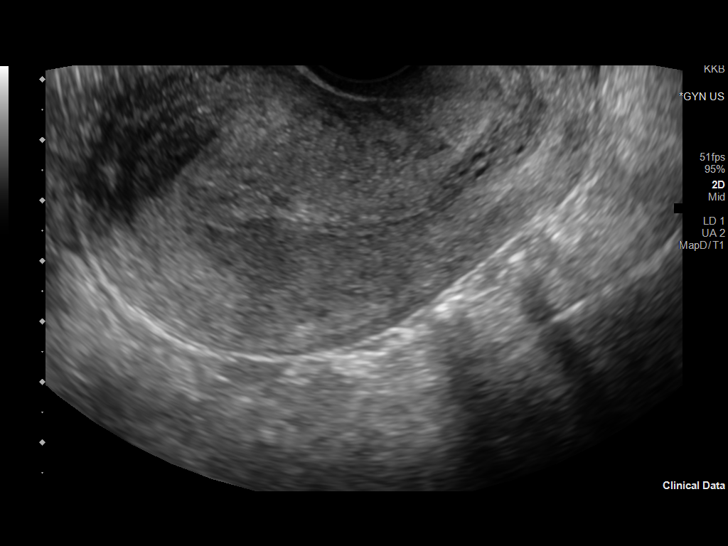
[im 14/41]
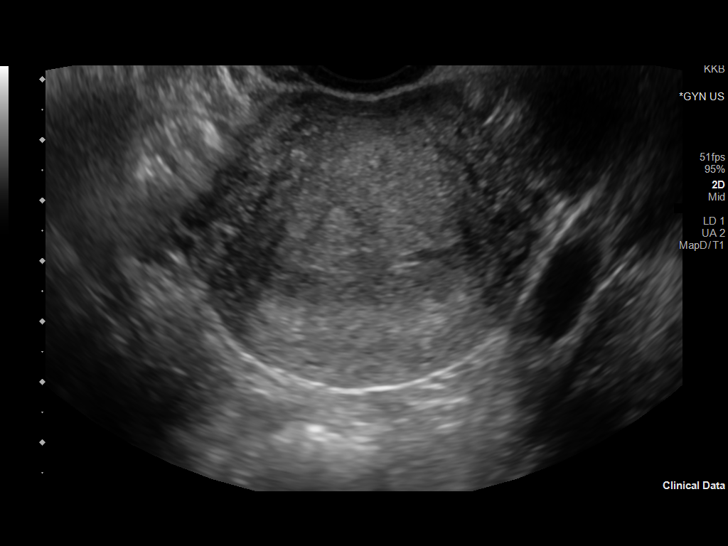
[im 16/41]
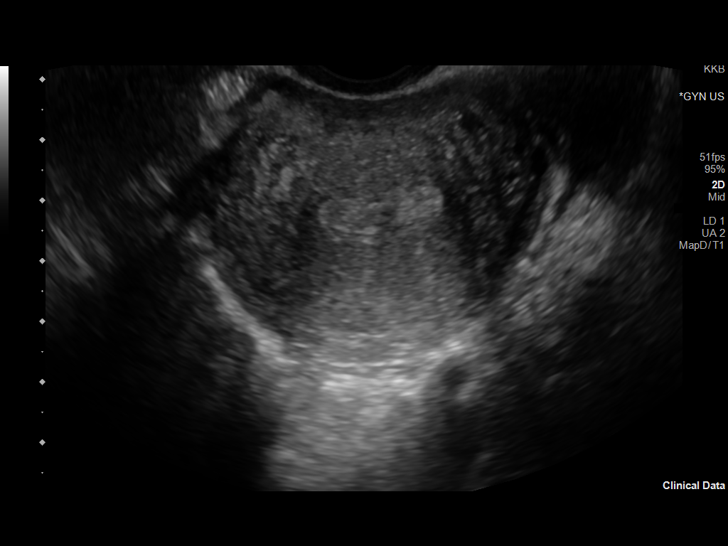
[im 19/41]
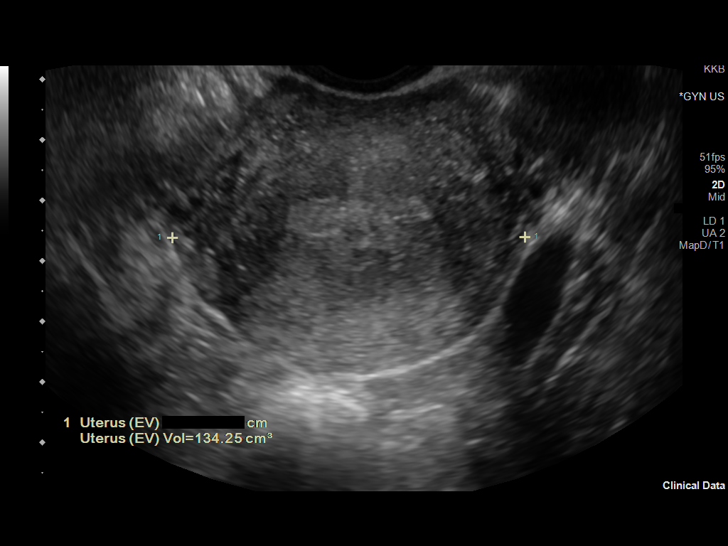
[im 22/41]
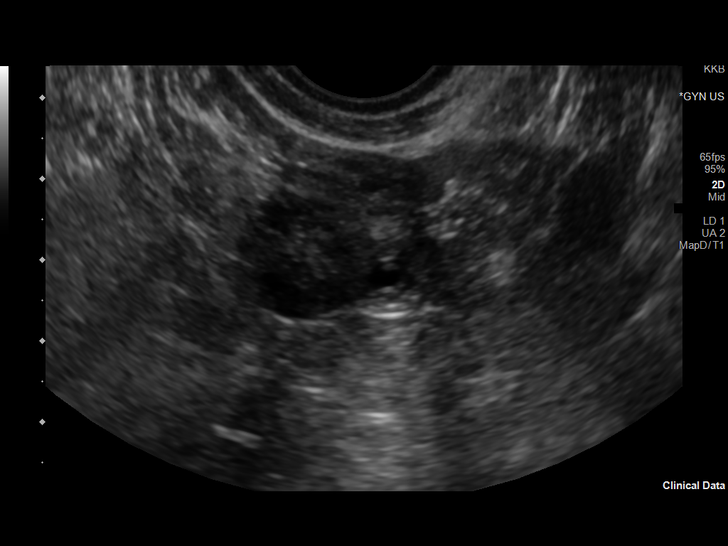
[im 26/41]
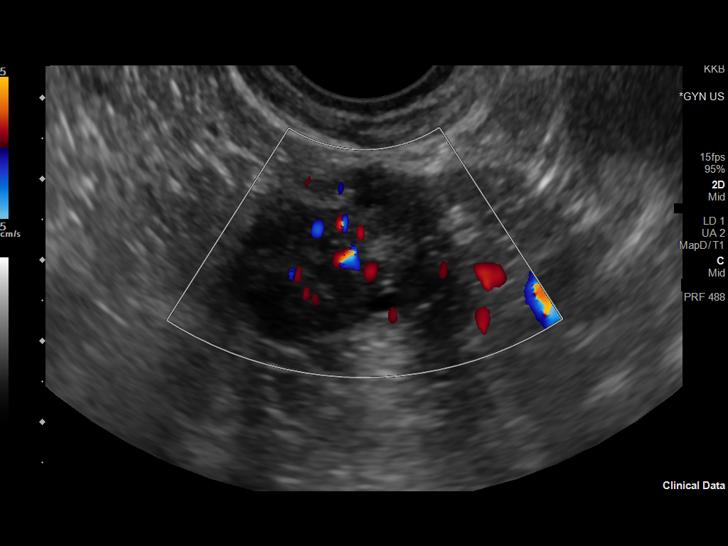
[im 27/41]
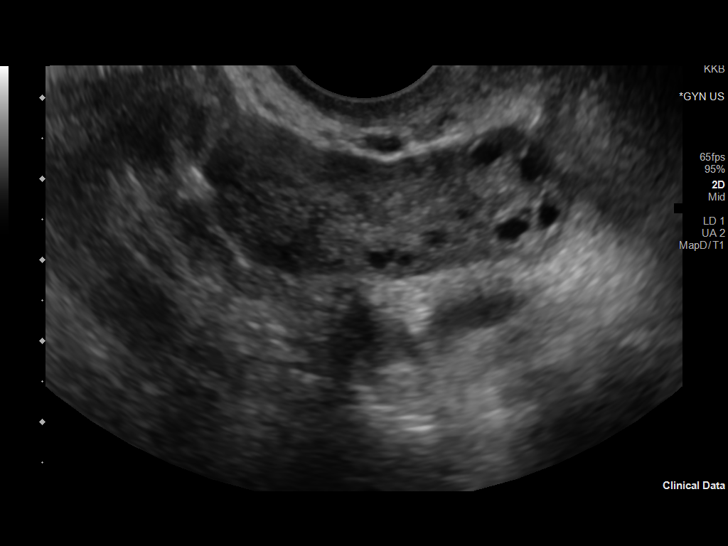
[im 31/41]
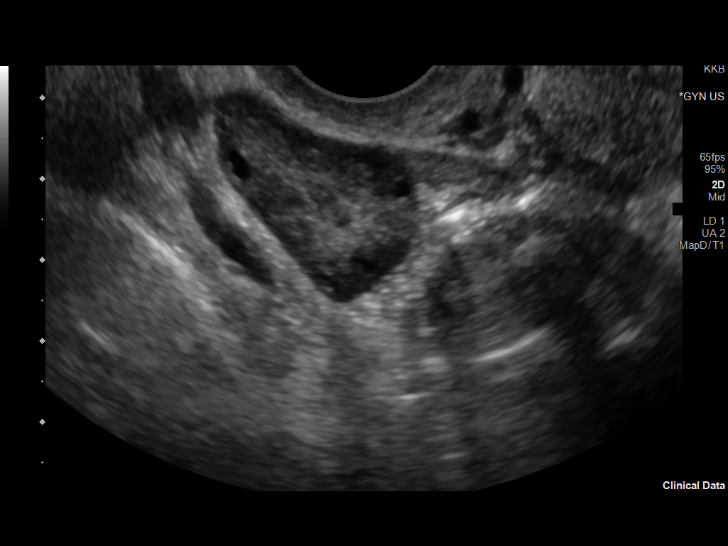
[im 34/41]
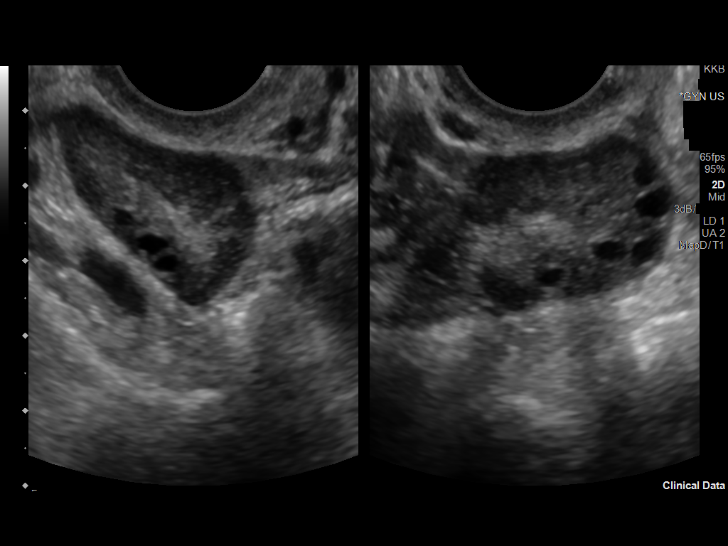
[im 37/41]
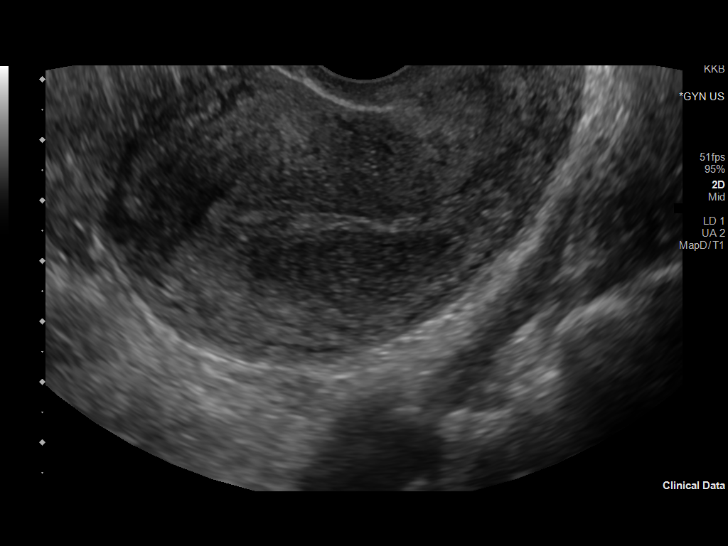
[im 41/41]
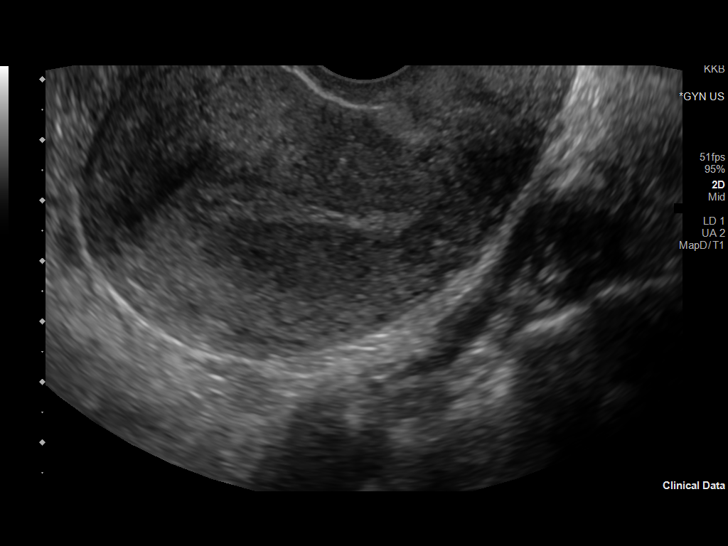

[14 of 25 positions shown; findings below may reference images not displayed]

FINDINGS: Uterus

Measurements: 8.6 x 5.1 x 5.8 cm = volume: 134 mL. No fibroids or
other mass visualized.

Endometrium

Thickness: 11 mm.  No focal abnormality visualized.

Right ovary

Measurements: 3.3 x 1.8 x 3 cm = volume: 9.3 mL. Normal
appearance/no adnexal mass.

Left ovary

Measurements: 4.2 x 2.1 x 2.5 cm = volume: 0.5 mL. Normal
appearance/no adnexal mass.

Other findings:  No abnormal free fluid
IMPRESSION: Negative pelvic ultrasound

## 2021-11-20 ENCOUNTER — Other Ambulatory Visit: Payer: Self-pay

## 2021-11-20 ENCOUNTER — Emergency Department (HOSPITAL_BASED_OUTPATIENT_CLINIC_OR_DEPARTMENT_OTHER)
Admission: EM | Admit: 2021-11-20 | Discharge: 2021-11-20 | Disposition: A | Discharge status: Home / Self Care | Payer: 59 | Attending: Emergency Medicine | Admitting: Emergency Medicine

## 2021-11-20 ENCOUNTER — Encounter (HOSPITAL_BASED_OUTPATIENT_CLINIC_OR_DEPARTMENT_OTHER): Payer: Self-pay

## 2021-11-20 DIAGNOSIS — J45909 Unspecified asthma, uncomplicated: Secondary | ICD-10-CM | POA: Diagnosis not present

## 2021-11-20 DIAGNOSIS — J029 Acute pharyngitis, unspecified: Secondary | ICD-10-CM | POA: Diagnosis not present

## 2021-11-20 DIAGNOSIS — R059 Cough, unspecified: Secondary | ICD-10-CM | POA: Diagnosis present

## 2021-11-20 DIAGNOSIS — J3489 Other specified disorders of nose and nasal sinuses: Secondary | ICD-10-CM | POA: Diagnosis not present

## 2021-11-20 DIAGNOSIS — Z20822 Contact with and (suspected) exposure to covid-19: Secondary | ICD-10-CM | POA: Insufficient documentation

## 2021-11-20 DIAGNOSIS — R051 Acute cough: Secondary | ICD-10-CM | POA: Diagnosis not present

## 2021-11-20 LAB — RESP PANEL BY RT-PCR (FLU A&B, COVID) ARPGX2
Influenza A by PCR: NEGATIVE
Influenza B by PCR: NEGATIVE
SARS Coronavirus 2 by RT PCR: NEGATIVE

## 2021-11-20 MED ORDER — BENZONATATE 100 MG PO CAPS: 100.0000 mg | ORAL_CAPSULE | Freq: Three times a day (TID) | ORAL | 0 refills | Status: DC

## 2021-11-20 NOTE — ED Triage Notes (Signed)
Pt c/o flu like sx x 2 weeks-NAD-steady gait 

## 2021-11-20 NOTE — Discharge Instructions (Addendum)
You were seen today for a cough. Your respiratory Covid and flu tests are pending. I have prescribed a cough medication. Do not take if there is a possibility of pregnancy. Follow up with your PCP as discussed if symptoms fail to improve. Return to the emergency department if you develop life threatening conditions such as chest pain or shortness of breath  ?

## 2021-11-20 NOTE — ED Provider Notes (Signed)
?[facility] ?Provider Note ? ? ?CSN: [id number] ?Arrival date & time: 11/20/21  1431 ? ?  ? ?History ? ?Chief Complaint  ?Patient presents with  ? Cough  ? ? ?Regina Carr is a 21 y.o. female.  Patient presents to the emergency department complaining of 2 weeks of cough.  The patient states she initially had a sore throat and developed some nasal drainage, this developed cough.  She does state that she has had some sputum production with the cough but that has been minimal.  She denies shortness of breath, denies chest pain.  She denies fever, headache.  Denies body aches.  Endorses cough and sore throat.  PMH significant for childhood asthma ? ?HPI ? ?  ? ?Home Medications ?Prior to Admission medications   ?Medication Sig Start Date End Date Taking? Authorizing Provider  ?benzonatate (TESSALON) 100 MG capsule Take 1 capsule (100 mg total) by mouth every 8 (eight) hours. 11/20/21  Yes Cherlynn June B, PA-C  ?ibuprofen (ADVIL) 800 MG tablet Take 1 tablet (800 mg total) by mouth every 8 (eight) hours as needed. 05/31/21   Truett Mainland, DO  ?Norethindrone Acetate-Ethinyl Estrad-FE (LOESTRIN 24 FE) 1-20 MG-MCG(24) tablet Take 1 tablet by mouth daily. 05/31/21   Truett Mainland, DO  ?   ? ?Allergies    ?Patient has no known allergies.   ? ?Review of Systems   ?Review of Systems  ?Constitutional:  Negative for chills and fever.  ?HENT:  Positive for congestion and sore throat. Negative for sinus pain.   ?Respiratory:  Positive for cough. Negative for shortness of breath and wheezing.   ?Cardiovascular:  Negative for chest pain.  ?Gastrointestinal:  Negative for abdominal pain.  ?Neurological:  Negative for headaches.  ? ?Physical Exam ?Updated Vital Signs ?BP (!) 155/84 (BP Location: Right Arm)   Pulse (!) 101   Temp 97.7 ?F (36.5 ?C)   Resp 18   Ht 5' (1.524 m)   Wt 94.8 kg   LMP 10/19/2021   SpO2 98%   BMI 40.82 kg/m?  ?Physical Exam ?Vitals and nursing note reviewed.   ?Constitutional:   ?   General: She is not in acute distress. ?HENT:  ?   Head: Normocephalic.  ?   Mouth/Throat:  ?   Mouth: Mucous membranes are moist.  ?   Pharynx: Oropharynx is clear. No posterior oropharyngeal erythema.  ?Eyes:  ?   Conjunctiva/sclera: Conjunctivae normal.  ?Cardiovascular:  ?   Rate and Rhythm: Normal rate and regular rhythm.  ?   Pulses: Normal pulses.  ?Pulmonary:  ?   Effort: Pulmonary effort is normal. No respiratory distress.  ?   Breath sounds: Normal breath sounds. No wheezing, rhonchi or rales.  ?Abdominal:  ?   Palpations: Abdomen is soft.  ?   Tenderness: There is no abdominal tenderness.  ?Musculoskeletal:  ?   Cervical back: Normal range of motion.  ?Skin: ?   General: Skin is warm and dry.  ?Neurological:  ?   Mental Status: She is alert.  ? ? ?ED Results / Procedures / Treatments   ?Labs ?(all labs ordered are listed, but only abnormal results are displayed) ?Labs Reviewed  ?RESP PANEL BY RT-PCR (FLU A&B, COVID) [id number]  ? ? ?EKG ?None ? ?Radiology ?No results found. ? ?Procedures ?Procedures  ? ? ?Medications Ordered in ED ?Medications - No data to display ? ?ED Course/ Medical Decision Making/ A&P ?Clinical Course as of 11/20/21 1546  ?Wed Nov 20, 2021  ?1536 Heart rate taken manually 90 [LM]  ?  ?Clinical Course User Index ?[LM] Dorothyann Peng, PA-C  ? ?                        ?Medical Decision Making ? ?This patient presents to the ED for concern of cough, this involves an extensive number of treatment options, and is a complaint that carries with it a high risk of complications and morbidity.  The differential diagnosis includes but is not limited to acute bronchitis, asthma exacerbation, pneumonia, and others ? ? ?Co morbidities that complicate the patient evaluation ? ?History of childhood asthma ? ?Lab Tests: ? ?I Ordered, and personally interpreted labs.  The pertinent results include:  Covid and flu panel pending ? ? ? ?Test / Admission - Considered: ? ?The patient  has a cough that has lasted for two weeks.  Vital signs are normal with no tachycardia, no tachypnea, oxygen saturation 97-98%, and no fever. There are no abnormal breath sounds. My suspicion for pneumonia or asthma exacerbation is very low. The patient likely has acute bronchitis following a URI based on her history. There is no reason for further testing or imaging at this time. I feel that the patient may discharge home. I will prescribed benzonatate pearls for the cough. The patient states there is no possibility of pregnancy and understands that she should discontinue the medication if there is a chance of pregnancy. I recommend PCP follow up if symptoms fail to improve.   ? ?Final Clinical Impression(s) / ED Diagnoses ?Final diagnoses:  ?Acute cough  ? ? ?Rx / DC Orders ?ED Discharge Orders   ? ?      Ordered  ?  benzonatate (TESSALON) 100 MG capsule  Every 8 hours       ? 11/20/21 1545  ? ?  ?  ? ?  ? ? ?  ?Dorothyann Peng, PA-C ?11/20/21 1546 ? ?  ?Deno Etienne, DO ?11/20/21 1609 ? ?

## 2022-07-12 ENCOUNTER — Emergency Department (HOSPITAL_BASED_OUTPATIENT_CLINIC_OR_DEPARTMENT_OTHER)
Admission: EM | Admit: 2022-07-12 | Discharge: 2022-07-12 | Disposition: A | Discharge status: Home / Self Care | Payer: No Typology Code available for payment source | Attending: Emergency Medicine | Admitting: Emergency Medicine

## 2022-07-12 ENCOUNTER — Emergency Department (HOSPITAL_BASED_OUTPATIENT_CLINIC_OR_DEPARTMENT_OTHER): Payer: No Typology Code available for payment source

## 2022-07-12 ENCOUNTER — Encounter (HOSPITAL_BASED_OUTPATIENT_CLINIC_OR_DEPARTMENT_OTHER): Payer: Self-pay | Admitting: Emergency Medicine

## 2022-07-12 ENCOUNTER — Other Ambulatory Visit: Payer: Self-pay

## 2022-07-12 DIAGNOSIS — M25511 Pain in right shoulder: Secondary | ICD-10-CM | POA: Insufficient documentation

## 2022-07-12 DIAGNOSIS — Y92410 Unspecified street and highway as the place of occurrence of the external cause: Secondary | ICD-10-CM | POA: Diagnosis not present

## 2022-07-12 DIAGNOSIS — M25551 Pain in right hip: Secondary | ICD-10-CM | POA: Diagnosis not present

## 2022-07-12 LAB — PREGNANCY, URINE: Preg Test, Ur: NEGATIVE

## 2022-07-12 MED ORDER — IBUPROFEN 400 MG PO TABS
600.0000 mg | ORAL_TABLET | Freq: Once | ORAL | Status: AC
  Administered 2022-07-12: 600 mg via ORAL
  Filled 2022-07-12: qty 1

## 2022-07-12 NOTE — ED Notes (Signed)
D/c paperwork reviewed with pt, including f/u care. No questions or concerns at time of d/c. Pt ambulatory to ED exit with visitor, NAD.
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
## 2022-07-12 NOTE — ED Notes (Signed)
ED Provider at bedside. 

## 2022-07-12 NOTE — ED Notes (Signed)
Unable to give urine at this time. Given po fluids

## 2022-07-12 NOTE — Discharge Instructions (Addendum)
Note the work-up today was all reassuring.  X-rays were negative for any acute fracture or dislocation.  Recommend symptomatic therapy at home with rest, ice, NSAIDs such as ibuprofen/Aleve or Tylenol as needed for pain.  Please do not hesitate to return to emergency department for worrisome signs symptoms we discussed with him parent.

## 2022-09-10 ENCOUNTER — Ambulatory Visit: Payer: Self-pay | Admitting: Family Medicine

## 2022-10-30 ENCOUNTER — Ambulatory Visit: Payer: Self-pay | Admitting: Family Medicine

## 2022-12-10 ENCOUNTER — Encounter: Payer: Self-pay | Admitting: Family Medicine

## 2022-12-11 NOTE — Progress Notes (Signed)
Patient canceled appt

## 2022-12-30 ENCOUNTER — Ambulatory Visit: Payer: BC Managed Care – PPO

## 2022-12-30 ENCOUNTER — Other Ambulatory Visit (HOSPITAL_COMMUNITY)
Admission: RE | Admit: 2022-12-30 | Discharge: 2022-12-30 | Disposition: A | Discharge status: Home / Self Care | Payer: BC Managed Care – PPO | Source: Ambulatory Visit | Attending: Family Medicine | Admitting: Family Medicine

## 2022-12-30 VITALS — BP 122/68 | HR 90 | Ht 60.0 in | Wt 205.0 lb

## 2022-12-30 DIAGNOSIS — N939 Abnormal uterine and vaginal bleeding, unspecified: Secondary | ICD-10-CM | POA: Diagnosis not present

## 2022-12-30 DIAGNOSIS — Z01419 Encounter for gynecological examination (general) (routine) without abnormal findings: Secondary | ICD-10-CM | POA: Insufficient documentation

## 2022-12-30 DIAGNOSIS — Z1239 Encounter for other screening for malignant neoplasm of breast: Secondary | ICD-10-CM

## 2022-12-30 DIAGNOSIS — Z113 Encounter for screening for infections with a predominantly sexual mode of transmission: Secondary | ICD-10-CM | POA: Diagnosis present

## 2022-12-30 NOTE — Progress Notes (Unsigned)
GYNECOLOGY OFFICE VISIT NOTE-WELL WOMAN EXAM  History:   Regina Carr is a 22 year old here today for "yearly." She is present today with her friend.  She is concerned with back pain associated with breast size.  She reports she has been having "aching" for about the past 1-2 months.  She states it is intermittent.  She notes that pain improves with rest and is worse with sitting upright for extended periods.    She reports LMP was in Feb and reports she has 20-22 days with no cycle the following month.  She states the flow is light to heavy.  She reports passing of clots that are about quarter sized. She endorses cramping that 8/10 and unrelieved with ibuprofen and tylenol. She states she has tried oral contraception with inconsistency in administration and the patch with worsening of symptoms.   Birth Control:  Interested for cycle regulation.   Reproductive Concerns Sexually Active:Not currently Partners Type: Both Number of partners in last year: One STD Testing: Desires Full  Obstetrical History: [id number]  Gynecological History: None Vaginal/GU Concerns: As above. No issues with urination, constipation, or diarrhea.  Breast Concerns/Exams: She denies issues, but reports some occasional tenderness.  She does not performed breast exams, but endorses breast awareness.  She denies known family history of breast, uterine, cervical, or ovarian cancer.  Medical and Nutrition PCP: None Significant PMx: Asthma when younger. No current symptoms Exercise: Recent onset-April. 4x/week for one hour with cardio work-outs.  Tobacco/Drugs/Alcohol: Denies Usage Nutrition: States "okay" when asked about balanced nutritional intake.  Desires weight loss. Reports discontinuation of red meat in past month.   Social Safety at home: Endorses. 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  The following portions of the patient's history were reviewed and updated as appropriate: allergies, current medications, past family history, past medical history, past social history, past surgical history and problem list.   Health Maintenance: Pap: March 2022, NILM Results.  Mammogram: N/A.  Colonoscopy: N/A Review of Systems:  Pertinent items noted in HPI and remainder of comprehensive ROS otherwise negative.    Objective:    Physical Exam BP 122/68   Pulse 90   Ht 5' (1.524 m)   Wt 205 lb (93 kg)   LMP 10/03/2022 (Exact Date)   BMI 40.04 kg/m  Physical Exam Vitals reviewed. Exam conducted with a chaperone present.  Constitutional:      Appearance: Normal appearance.  HENT:     Head: Normocephalic and atraumatic.  Eyes:     Conjunctiva/sclera: Conjunctivae normal.  Cardiovascular:     Rate and Rhythm: Normal rate.  Pulmonary:     Effort: Pulmonary effort is normal. No respiratory distress.     Breath sounds: Normal breath sounds.  Chest:  Breasts:    Right: No mass, nipple discharge, skin change or tenderness.     Left: No mass, nipple discharge, skin change or tenderness.     Comments: CBE normal. Abdominal:     General: Bowel sounds are normal.     Palpations: Abdomen is soft.     Tenderness: There is no abdominal tenderness.  Genitourinary:    Comments: CV collected blindly Musculoskeletal:        General: Normal range of motion.     Cervical back: Normal range of motion.  Skin:  General: Skin is warm and dry.  Neurological:     Mental Status: She is alert and oriented to person, place, and time.  Psychiatric:        Mood and Affect: Mood normal.        Behavior: Behavior normal.      Labs and Imaging Results for orders placed or performed in visit on 12/30/22 (from the past 168 hour(s))  Cervicovaginal ancillary only   Collection Time: 12/30/22 11:05 AM  Result Value Ref Range   Neisseria  Gonorrhea Negative    Chlamydia Negative    Comment Normal Reference Ranger Chlamydia - Negative    Comment      Normal Reference Range Neisseria Gonorrhea - Negative  HepB+HepC+HIV Panel   Collection Time: 12/30/22 11:23 AM  Result Value Ref Range   Hepatitis B Surface Ag Negative Negative   Hep B E Ag Negative Negative   Hep B C IgM Negative Negative   Hep B Core Total Ab Negative Negative   Hep B E Ab Non Reactive Negative   Hep B Surface Ab, Qual Reactive    Hep C Virus Ab Non Reactive Non Reactive   HIV Screen 4th Generation wRfx Non Reactive Non Reactive  RPR   Collection Time: 12/30/22 11:23 AM  Result Value Ref Range   RPR Ser Ql Non Reactive Non Reactive   No results found.   Assessment & Plan:  22 year old Well Woman Exam STD Screen AUB  1. Encounter for well woman exam -Exam performed and findings discussed. -Educated on ASCCP guidelines regarding pap smear evaluation and frequency. -Informed that next pap due in 2025.  -Encouraged to activate and utilize Mychart for reviewing of results, communication with office, and scheduling of appts. -Educated on AHA exercise recommendations of 30 minutes of moderate to vigorous activity at least 5x/week.  2. Screening examination for STD (sexually transmitted disease) -CV collected -Plan for treatment as appropriate.  3. Abnormal uterine bleeding (AUB) -Discussed bleeding and methods for regulation. -Reviewed usage of oral contraception and IUD as patient declines Depo. -Discussed sending for [place] to assess uterine lining, size, and ovaries.  -Patient agreeable.   4. Encounter for screening breast examination -Educated and encouraged to initiate SBE with increased breast awareness including examination of breast for skin changes, moles, tenderness, etc.  -Discussed follow up with associated back pain r/t breast size with PCP.   Routine preventative health maintenance measures emphasized. Please refer to After Visit  Summary for other counseling recommendations.   No follow-ups on file.      Cherre Robins, CNM 12/30/2022

## 2022-12-30 NOTE — Patient Instructions (Signed)
AREA FAMILY PRACTICE PHYSICIANS  [place] ([address]) [facility] [address] [place], [place] [address] [phone] Mon-Fri 8:30-12:30, 1:30-5:00 Accepting Medicaid [facility] [address], [place], [place] [address] [phone] Mon-Fri 8:00-5:30 [facility] [address] [place], [place] [address] [phone] Mon, Tue, Thur, Fri 8:30-5:00, Wed 10:00-7:00 (closed 1-2pm) Accepting Medicaid [facility] [address], [place], [place]  [address] Phone - [phone]   Fax - [phone]  [place] ([address]) [facility] [address] [place], [place] [address] [phone] Mon-Fri 8:00-5:00 [facility] ([facility])  [address] [place], [place] [address] [phone] Mon-Fri 8:30-5:30, Sat (Oct.-Mar.) 9:00-1:00 Accepting Medicaid  [place] ([address]) [facility] [address], [place], [place] [address] [phone] Mon-Fri 8:00-5:00  [place] ([address]) [facility] [address], [place], [place] [address] [phone] Mon-Fri 8:00-5:00 [facility] [address], [place], [place] [address] [phone] Mon-Fri 8:00-5:00 [facility] [address] [place], [place] [address] [phone] Mon-Fri 8:00-5:00 [facility] [address] [place] [address] [phone] Mon-Fri 7:30-5:30  [place] ([address] & [address]) [facility] [address] [place], [place] [address] [phone] Mon-Thur 8:00-6:00 Accepting Medicaid [facility] [address] [place], [place] [address] [phone] Mon-Thur 7:30-7:30, Fri 7:30-4:30 Accepting  Medicaid [facility] [address], [place], [place]  [address] [phone]   Fax - [phone]  [place] ([address] & [address]) [facility] [address] , [place] [address] [phone] Mon-Fri 7:00-5:00 [facility] [address], [place], [place] [address] [phone] Mon-Fri 8:00-5:00 Accepting Medicaid [facility] [address], , [place] [address] [phone] Mon-Fri 8:00-5:00 Accepting Medicaid  [place] ([address]) [facility] [place] [address] [place], [place] [address] [phone] Mon-Fri 8:00-5:00 [facility] [address], [place], [place] [address] [phone] Mon-Fri 8:00-5:00 Accepting Medicaid [facility] ([facility]) [address], [place], [place] [address] [phone] Mon-Fri 8:00-5:30, Sat&Sun by appointment (phones open at 8:30) Accepting Medicaid  [place] ([address]) [facility] [address] [place], [place] [address] [phone] Mon-Thur 8:00-7:00, Fri 8:00-5:00, Sat 8:00-12:00, Sun 9:00-12:00 Accepting Medicaid [facility] [place] [address], [place], [place] [address] [phone] Mon-Thur 8:00-5:00 Accepting Medicaid [facility] [address] [place], [place] [address] [phone] Mon-Fri 8:00-5:30, Sat (Oct.-Mar.) 9:00-1:00 Accepting Medicaid  [place] ([address]) [facility] [address], [place], [place] [address] [phone] Mon-Fri 8:00-5:00 Accepting Medicaid   [place] ([address]) [facility] [place] [address], [place], [place] [address] [phone] Mon-Fri 8:00-5:00  [facility] [place] [address], [place], [place] [address] [phone] Mon-Fri 8:00-5:00 [facility] [place] [address] [place] [address], [place], [place] [address] [phone] Mon-Fri 8:00-5:00 After hours clinic ([address] [place], [place] [address]) [phone] Mon-Fri 5:00-8:00, Sat 12:00-6:00, Sun 10:00-4:00 Accepting Medicaid [facility] [place]   [address], [place], [place]  [address] [phone]   Fax - [phone]  [place] ([address])  [facility] [place] [address], [place], [place] [address] [phone] Mon-Fri 8:00-5:00 [facility] [place] ([facility] [place]) [address], [place], [place] [address] [phone] Mon-Thur 8:00-7:00, Fri 8:00-5:00, Sat 8:00-12:00    

## 2022-12-31 LAB — HEPB+HEPC+HIV PANEL
HIV Screen 4th Generation wRfx: NONREACTIVE
Hep B C IgM: NEGATIVE
Hep B Core Total Ab: NEGATIVE
Hep B E Ab: NONREACTIVE
Hep B E Ag: NEGATIVE
Hep B Surface Ab, Qual: REACTIVE
Hep C Virus Ab: NONREACTIVE
Hepatitis B Surface Ag: NEGATIVE

## 2022-12-31 LAB — CERVICOVAGINAL ANCILLARY ONLY
Chlamydia: NEGATIVE
Comment: NEGATIVE
Comment: NORMAL
Neisseria Gonorrhea: NEGATIVE

## 2022-12-31 LAB — RPR: RPR Ser Ql: NONREACTIVE

## 2023-01-05 ENCOUNTER — Ambulatory Visit (HOSPITAL_BASED_OUTPATIENT_CLINIC_OR_DEPARTMENT_OTHER)
Admission: RE | Admit: 2023-01-05 | Discharge: 2023-01-05 | Disposition: A | Discharge status: Home / Self Care | Payer: BC Managed Care – PPO | Source: Ambulatory Visit

## 2023-01-05 DIAGNOSIS — Z01419 Encounter for gynecological examination (general) (routine) without abnormal findings: Secondary | ICD-10-CM | POA: Diagnosis not present

## 2023-01-05 DIAGNOSIS — N939 Abnormal uterine and vaginal bleeding, unspecified: Secondary | ICD-10-CM

## 2023-01-19 ENCOUNTER — Ambulatory Visit (INDEPENDENT_AMBULATORY_CARE_PROVIDER_SITE_OTHER): Payer: BC Managed Care – PPO | Admitting: Plastic Surgery

## 2023-01-19 ENCOUNTER — Encounter: Payer: Self-pay | Admitting: Plastic Surgery

## 2023-01-19 VITALS — BP 128/77 | HR 106 | Ht 63.0 in | Wt 207.2 lb

## 2023-01-19 DIAGNOSIS — M542 Cervicalgia: Secondary | ICD-10-CM | POA: Diagnosis not present

## 2023-01-19 DIAGNOSIS — N62 Hypertrophy of breast: Secondary | ICD-10-CM

## 2023-01-19 DIAGNOSIS — M546 Pain in thoracic spine: Secondary | ICD-10-CM

## 2023-01-19 DIAGNOSIS — Z6836 Body mass index (BMI) 36.0-36.9, adult: Secondary | ICD-10-CM | POA: Diagnosis not present

## 2023-01-19 NOTE — Progress Notes (Signed)
Referring Provider Susann Givens, PA-C [address] [place],  [place] [address]   CC:  Chief Complaint  Patient presents with   Advice Only      Regina Carr is an 22 y.o. female.  HPI: Ms. Novosel is a 22 year old female who complains of upper back and neck pain which she attributes to the large size of her breast.  She states that her breast have always been large since they began development but over the past couple of years she has had increasing difficulty finding bras that fit and increasing difficulty with her upper back and neck pain.  She also feels that her breast interfere with her daily activities.  She is interested in surgical reduction in the size of her breast.  No Known Allergies  Outpatient Encounter Medications as of 01/19/2023  Medication Sig   levothyroxine (SYNTHROID) 25 MCG tablet Take 25 mcg by mouth daily.   Semaglutide-Weight Management (WEGOVY) 0.25 MG/0.5ML SOAJ Inject into the skin.   [DISCONTINUED] benzonatate (TESSALON) 100 MG capsule Take 1 capsule (100 mg total) by mouth every 8 (eight) hours. (Patient not taking: Reported on 12/30/2022)   [DISCONTINUED] ibuprofen (ADVIL) 800 MG tablet Take 1 tablet (800 mg total) by mouth every 8 (eight) hours as needed. (Patient not taking: Reported on 12/30/2022)   [DISCONTINUED] Norethindrone Acetate-Ethinyl Estrad-FE (LOESTRIN 24 FE) 1-20 MG-MCG(24) tablet Take 1 tablet by mouth daily. (Patient not taking: Reported on 12/30/2022)   No facility-administered encounter medications on file as of 01/19/2023.     Past Medical History:  Diagnosis Date   Asthma     No past surgical history on file.  No family history on file.  Social History   Social History Narrative   Not on file     Review of Systems General: Denies fevers, chills, weight loss CV: Denies chest pain, shortness of breath, palpitations Breast: Patient complains of upper back and neck pain which she attributes to  the large size of her breast.  Otherwise she denies any significant breast problems  Physical Exam    01/19/2023    2:41 PM 12/30/2022   10:38 AM 07/12/2022   12:45 PM  Vitals with BMI  Height 5\' 3"  5\' 0"    Weight 207 lbs 3 oz 205 lbs   BMI 36.71 40.04   Systolic 128 122 409  Diastolic 77 68 86  Pulse 106 90     General:  No acute distress,  Alert and oriented, Non-Toxic, Normal speech and affect Breast: Patient has very large breasts with grade 3 ptosis.  She has no dominant masses and no nipple abnormalities.  She has no nipple discharge on exam today.  She does have very medially centered nipples.  Her sternal notch to nipple distance on the right is 36 cm and 35 cm on the left.  Her fold to nipple distance on the right is 18 cm and 19 cm on the left. Mammogram: Not applicable due to age Assessment/Plan Macromastia: Patient has very large breasts and would be a good candidate for a bilateral breast reduction which I believe would result in improvement of her upper back and neck pain.  I believe that I can remove between 800 g and 900 g per breast.  We discussed the procedure at length including the location of the incisions and the unpredictable nature of scarring.  We discussed the risks of bleeding, infection, and seroma formation.  We discussed the use of drains postoperatively.  We discussed  the risks of nipple loss due to nipple ischemia.  We discussed the fact that approximately 9% of women will be unable to successfully breast-feed postoperatively.  We discussed the postoperative limitations including no heavy lifting greater than 20 pounds, no vigorous activity, and no submerging the incisions underwater.  Patient will be able to shower 24 hours after surgery and she is strongly encouraged to ambulate immediately after the procedure.  She will need to wear a compressive garment for 6 weeks.  All questions were answered to her satisfaction.  Photographs were obtained today with her  consent.  Will submit a surgical route at her request.  Santiago Glad 01/19/2023, 4:41 PM

## 2023-03-23 ENCOUNTER — Telehealth: Payer: Self-pay | Admitting: Plastic Surgery

## 2023-03-23 NOTE — Telephone Encounter (Signed)
Patient called to check on status of insurance approval so that Sx can be scheduled.  She has finished PT last week. Please call her at [phone]

## 2023-04-20 NOTE — Telephone Encounter (Signed)
Patient called back and said she had never heard back from anyone and asked for a call back at [phone]. Please advise

## 2023-04-22 ENCOUNTER — Telehealth: Payer: Self-pay | Admitting: Plastic Surgery

## 2023-04-22 NOTE — Telephone Encounter (Signed)
TRLVM WITH FEMALE: asked her to have ptn call office.

## 2023-05-06 ENCOUNTER — Encounter: Payer: Self-pay | Admitting: Surgical

## 2023-05-06 ENCOUNTER — Ambulatory Visit (INDEPENDENT_AMBULATORY_CARE_PROVIDER_SITE_OTHER): Payer: BC Managed Care – PPO | Admitting: Surgical

## 2023-05-06 VITALS — BP 131/79 | HR 89

## 2023-05-06 DIAGNOSIS — N62 Hypertrophy of breast: Secondary | ICD-10-CM

## 2023-05-06 MED ORDER — ONDANSETRON HCL 4 MG PO TABS: 4.0000 mg | ORAL_TABLET | Freq: Three times a day (TID) | ORAL | 0 refills | Status: DC | PRN

## 2023-05-06 MED ORDER — OXYCODONE HCL 5 MG PO TABS: 5.0000 mg | ORAL_TABLET | Freq: Four times a day (QID) | ORAL | 0 refills | Status: AC | PRN

## 2023-05-06 NOTE — Progress Notes (Addendum)
Patient ID: Regina Carr, female    DOB: Jun 08, 2001, 22 y.o.   MRN: [id number]  Chief Complaint  Patient presents with   Pre-op Exam      ICD-10-CM   1. Macromastia  N62       History of Present Illness: Regina Carr is a 22 y.o.  female  with a history of macromastia.  She presents for preoperative evaluation for upcoming procedure, Bilateral Breast Reduction , scheduled for 05/15/2023 with Dr.  Ladona Ridgel  No personal or family history of anesthesia per patient. No history of DVT/PE.  No family history of DVT/PE.  No family or personal history of bleeding or clotting disorders.  Patient is not currently taking any blood thinners.  No history of CVA/MI.  Patient is not on OCPs.  She was previously on Wegovy, however has not taken this medication for approximately 1 month.  She is aware that it needs to be held for 1 to 2 weeks prior to surgery.  Summary of Previous Visit: STN is 36 cm on the right and 35 cm on the left.  Nipple to fold on the right is 18 cm, 19 cm on the left.  Patient is aware of the risk of being unable to breast-feed postoperatively.  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  PMH Significant for: Hypothyroidism, macromastia  Patient reports that she is a non-smoker, she reports that she is approximately a 37DDD cup.  She does not have a specific postoperative size that she feels very strongly about, but does report that she wants to be much smaller.  No recent changes to her health.  No history of cardiac disease.  She does report a history of asthma as a child, but reports she no longer requires use of inhaler or has any asthmatic symptoms.  Past Medical History: Allergies: No Known Allergies  Current Medications:  Current Outpatient Medications:    levothyroxine (SYNTHROID) 25 MCG tablet, Take 25 mcg by mouth  daily., Disp: , Rfl:    Semaglutide-Weight Management (WEGOVY) 0.25 MG/0.5ML SOAJ, Inject into the skin., Disp: , Rfl:   Past Medical Problems: Past Medical History:  Diagnosis Date   Asthma     Past Surgical History: History reviewed. No pertinent surgical history.  Social History: Social History   Socioeconomic History   Marital status: Single    Spouse name: Not on file   Number of children: Not on file   Years of education: Not on file   Highest education level: Not on file  Occupational History   Not on file  Tobacco Use   Smoking status: Never   Smokeless tobacco: Never  Vaping Use   Vaping status: Never Used  Substance and Sexual Activity   Alcohol use: Never   Drug use: Never   Sexual activity: Not on file  Other Topics Concern   Not on file  Social History Narrative   Not on file   Social Determinants of Health   Financial Resource Strain: Low Risk  (01/12/2023)   [facility]   Overall Financial Resource Strain (CARDIA)    Difficulty of Paying Living Expenses: Not hard at all  Food Insecurity: No Food Insecurity (01/12/2023)   [facility], [facility]   Hunger Vital Sign    Worried About Running Out  of Food in the Last Year: Never true    Ran Out of Food in the Last Year: Never true  Transportation Needs: No Transportation Needs (01/12/2023)   [facility], [facility]   PRAPARE - Transportation    Lack of Transportation (Medical): No    Lack of Transportation (Non-Medical): No  Physical Activity: Not on file  Stress: Not on file  Social Connections: Unknown (01/13/2022)   Received from [facility], [facility]   Social Network    Social Network: Not on file  Intimate Partner Violence: Unknown (12/05/2021)   [facility] from [facility], [facility]   HITS    Physically Hurt: Not on file    Insult or Talk Down To: Not on file    Threaten Physical Harm: Not on file    Scream or  Curse: Not on file    Family History: History reviewed. No pertinent family history.  Review of Systems: Review of Systems  Constitutional: Negative.   Respiratory: Negative.    Cardiovascular: Negative.   Gastrointestinal: Negative.   Neurological: Negative.     Physical Exam: Vital Signs BP 131/79 (BP Location: Left Arm, Patient Position: Sitting, Cuff Size: Large)   Pulse 89   SpO2 99%   Physical Exam Constitutional:      General: Not in acute distress.    Appearance: Normal appearance. Not ill-appearing.  HENT:     Head: Normocephalic and atraumatic.  Eyes:     Pupils: Pupils are equal, round Neck:     Musculoskeletal: Normal range of motion.  Cardiovascular:     Rate and Rhythm: Normal rate    Pulses: Normal pulses.  Pulmonary:     Effort: Pulmonary effort is normal. No respiratory distress.  Musculoskeletal: Normal range of motion.  Skin:    General: Skin is warm and dry.     Findings: No erythema or rash.  Neurological:     General: No focal deficit present.     Mental Status: Alert and oriented to person, place, and time. Mental status is at baseline.     Motor: No weakness.  Psychiatric:        Mood and Affect: Mood normal.        Behavior: Behavior normal.    Assessment/Plan: The patient is scheduled for bilateral breast reduction with Dr. Ladona Ridgel.  Risks, benefits, and alternatives of procedure discussed, questions answered and consent obtained.    Smoking Status: Non-smoker; Counseling Given?  Discussed with patient risk of exposure to secondhand smoke, she was aware and will abstain from being exposed to this Last Mammogram: Not necessary due to age  Caprini Score: 3; Risk Factors include: BMI > 25, and length of planned surgery. Recommendation for mechanical prophylaxis. Encourage early ambulation.   Pictures obtained: @consult   Post-op Rx sent to pharmacy: Oxycodone, Zofran  Patient was provided with the breast reduction and General Surgical  Risk consent document and Pain Medication Agreement prior to their appointment.  They had adequate time to read through the risk consent documents and Pain Medication Agreement. We also discussed them in person together during this preop appointment. All of their questions were answered to their satisfaction.  Recommended calling if they have any further questions.  Risk consent form and Pain Medication Agreement to be scanned into patient's chart.  The risk that can be encountered with breast reduction were discussed and include the following but not limited to these:  Breast asymmetry, fluid accumulation, firmness of the breast, inability to breast  feed, loss of nipple or areola, skin loss, decrease or no nipple sensation, fat necrosis of the breast tissue, bleeding, infection, healing delay.  There are risks of anesthesia, changes to skin sensation and injury to nerves or blood vessels.  The muscle can be temporarily or permanently injured.  You may have an allergic reaction to tape, suture, glue, blood products which can result in skin discoloration, swelling, pain, skin lesions, poor healing.  Any of these can lead to the need for revisonal surgery or stage procedures.  A reduction has potential to interfere with diagnostic procedures.  Nipple or breast piercing can increase risks of infection.  This procedure is best done when the breast is fully developed.  Changes in the breast will continue to occur over time.  Pregnancy can alter the outcomes of previous breast reduction surgery, weight gain and weigh loss can also effect the long term appearance.     Electronically signed by: Kermit Balo Marce Schartz, PA-C 05/06/2023 1:26 PM

## 2023-05-06 NOTE — H&P (View-Only) (Signed)
Patient ID: Regina Carr, female    DOB: October 30, 2000, 22 y.o.   MRN: [id number]  Chief Complaint  Patient presents with   Pre-op Exam      ICD-10-CM   1. Macromastia  N62       History of Present Illness: Regina Carr is a 22 y.o.  female  with a history of macromastia.  She presents for preoperative evaluation for upcoming procedure, Bilateral Breast Reduction , scheduled for 05/15/2023 with Dr.  Ladona Ridgel  No personal or family history of anesthesia per patient. No history of DVT/PE.  No family history of DVT/PE.  No family or personal history of bleeding or clotting disorders.  Patient is not currently taking any blood thinners.  No history of CVA/MI.  Patient is not on OCPs.  She was previously on Wegovy, however has not taken this medication for approximately 1 month.  She is aware that it needs to be held for 1 to 2 weeks prior to surgery.  Summary of Previous Visit: STN is 36 cm on the right and 35 cm on the left.  Nipple to fold on the right is 18 cm, 19 cm on the left.  Patient is aware of the risk of being unable to breast-feed postoperatively.  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  PMH Significant for: Hypothyroidism, macromastia  Patient reports that she is a non-smoker, she reports that she is approximately a 37DDD cup.  She does not have a specific postoperative size that she feels very strongly about, but does report that she wants to be much smaller.  No recent changes to her health.  No history of cardiac disease.  She does report a history of asthma as a child, but reports she no longer requires use of inhaler or has any asthmatic symptoms.  Past Medical History: Allergies: No Known Allergies  Current Medications:  Current Outpatient Medications:    levothyroxine (SYNTHROID) 25 MCG tablet, Take 25 mcg by mouth  daily., Disp: , Rfl:    Semaglutide-Weight Management (WEGOVY) 0.25 MG/0.5ML SOAJ, Inject into the skin., Disp: , Rfl:   Past Medical Problems: Past Medical History:  Diagnosis Date   Asthma     Past Surgical History: History reviewed. No pertinent surgical history.  Social History: Social History   Socioeconomic History   Marital status: Single    Spouse name: Not on file   Number of children: Not on file   Years of education: Not on file   Highest education level: Not on file  Occupational History   Not on file  Tobacco Use   Smoking status: Never   Smokeless tobacco: Never  Vaping Use   Vaping status: Never Used  Substance and Sexual Activity   Alcohol use: Never   Drug use: Never   Sexual activity: Not on file  Other Topics Concern   Not on file  Social History Narrative   Not on file   Social Determinants of Health   Financial Resource Strain: Low Risk  (01/12/2023)   Received from [facility], [facility]   Overall Financial Resource Strain (CARDIA)    Difficulty of Paying Living Expenses: Not hard at all  Food Insecurity: No Food Insecurity (01/12/2023)   Received from  [facility], [facility]   Hunger Vital Sign    Worried About Running Out  of Food in the Last Year: Never true    Ran Out of Food in the Last Year: Never true  Transportation Needs: No Transportation Needs (01/12/2023)   Received from [facility], [facility]   PRAPARE - Transportation    Lack of Transportation (Medical): No    Lack of Transportation (Non-Medical): No  Physical Activity: Not on file  Stress: Not on file  Social Connections: Unknown (01/13/2022)   Received from [facility], [facility]   Social Network    Social Network: Not on file  Intimate Partner Violence: Unknown (12/05/2021)   Received from [facility], [facility]   HITS    Physically Hurt: Not on file    Insult or Talk Down To: Not on file    Threaten Physical Harm: Not on file    Scream or  Curse: Not on file    Family History: History reviewed. No pertinent family history.  Review of Systems: Review of Systems  Constitutional: Negative.   Respiratory: Negative.    Cardiovascular: Negative.   Gastrointestinal: Negative.   Neurological: Negative.     Physical Exam: Vital Signs BP 131/79 (BP Location: Left Arm, Patient Position: Sitting, Cuff Size: Large)   Pulse 89   SpO2 99%   Physical Exam Constitutional:      General: Not in acute distress.    Appearance: Normal appearance. Not ill-appearing.  HENT:     Head: Normocephalic and atraumatic.  Eyes:     Pupils: Pupils are equal, round Neck:     Musculoskeletal: Normal range of motion.  Cardiovascular:     Rate and Rhythm: Normal rate    Pulses: Normal pulses.  Pulmonary:     Effort: Pulmonary effort is normal. No respiratory distress.  Musculoskeletal: Normal range of motion.  Skin:    General: Skin is warm and dry.     Findings: No erythema or rash.  Neurological:     General: No focal deficit present.     Mental Status: Alert and oriented to person, place, and time. Mental status is at baseline.     Motor: No weakness.  Psychiatric:        Mood and Affect: Mood normal.        Behavior: Behavior normal.    Assessment/Plan: The patient is scheduled for bilateral breast reduction with Dr. Ladona Ridgel.  Risks, benefits, and alternatives of procedure discussed, questions answered and consent obtained.    Smoking Status: Non-smoker; Counseling Given?  Discussed with patient risk of exposure to secondhand smoke, she was aware and will abstain from being exposed to this Last Mammogram: Not necessary due to age  Caprini Score: 3; Risk Factors include: BMI > 25, and length of planned surgery. Recommendation for mechanical prophylaxis. Encourage early ambulation.   Pictures obtained: @consult   Post-op Rx sent to pharmacy: Oxycodone, Zofran  Patient was provided with the breast reduction and General Surgical  Risk consent document and Pain Medication Agreement prior to their appointment.  They had adequate time to read through the risk consent documents and Pain Medication Agreement. We also discussed them in person together during this preop appointment. All of their questions were answered to their satisfaction.  Recommended calling if they have any further questions.  Risk consent form and Pain Medication Agreement to be scanned into patient's chart.  The risk that can be encountered with breast reduction were discussed and include the following but not limited to these:  Breast asymmetry, fluid accumulation, firmness of the breast, inability to breast  feed, loss of nipple or areola, skin loss, decrease or no nipple sensation, fat necrosis of the breast tissue, bleeding, infection, healing delay.  There are risks of anesthesia, changes to skin sensation and injury to nerves or blood vessels.  The muscle can be temporarily or permanently injured.  You may have an allergic reaction to tape, suture, glue, blood products which can result in skin discoloration, swelling, pain, skin lesions, poor healing.  Any of these can lead to the need for revisonal surgery or stage procedures.  A reduction has potential to interfere with diagnostic procedures.  Nipple or breast piercing can increase risks of infection.  This procedure is best done when the breast is fully developed.  Changes in the breast will continue to occur over time.  Pregnancy can alter the outcomes of previous breast reduction surgery, weight gain and weigh loss can also effect the long term appearance.     Electronically signed by: Kermit Balo Marcquis Ridlon, PA-C 05/06/2023 1:26 PM

## 2023-05-07 ENCOUNTER — Encounter (HOSPITAL_BASED_OUTPATIENT_CLINIC_OR_DEPARTMENT_OTHER): Payer: Self-pay | Admitting: Plastic Surgery

## 2023-05-08 ENCOUNTER — Other Ambulatory Visit: Payer: Self-pay

## 2023-05-08 ENCOUNTER — Encounter (HOSPITAL_BASED_OUTPATIENT_CLINIC_OR_DEPARTMENT_OTHER): Payer: Self-pay | Admitting: Plastic Surgery

## 2023-05-08 NOTE — Progress Notes (Signed)
   05/08/23 0953  PAT Phone Screen  Is the patient taking a GLP-1 receptor agonist? Yes  Has the patient been informed on holding medication? (S)  Yes (last dose 04/23/23 will not resume until after surgery)  Do You Have Diabetes? No  Do You Have Hypertension? No  Have You Ever Been to the ER for Asthma? No  Have You Taken Oral Steroids in the Past 3 Months? No  Do you Take Phenteramine or any Other Diet Drugs? No  Recent  Lab Work, EKG, CXR? No  Do you have a history of heart problems? No  Any Recent Hospitalizations? No  Height 5\' 3"  (1.6 m)  Weight 93 kg  Pat Appointment Scheduled No  Reason for No Appointment Not Needed

## 2023-05-14 NOTE — Anesthesia Preprocedure Evaluation (Addendum)
Anesthesia Evaluation  Patient identified by MRN, date of birth, ID band Patient awake    Reviewed: Allergy & Precautions, H&P , NPO status , Patient's Chart, lab work & pertinent test results  Airway Mallampati: II  TM Distance: >3 FB Neck ROM: Full    Dental no notable dental hx.    Pulmonary neg pulmonary ROS   Pulmonary exam normal breath sounds clear to auscultation       Cardiovascular negative cardio ROS Normal cardiovascular exam Rhythm:Regular Rate:Normal     Neuro/Psych negative neurological ROS  negative psych ROS   GI/Hepatic negative GI ROS, Neg liver ROS,,,  Endo/Other  Hypothyroidism    Renal/GU negative Renal ROS  negative genitourinary   Musculoskeletal negative musculoskeletal ROS (+)    Abdominal   Peds negative pediatric ROS (+)  Hematology negative hematology ROS (+)   Anesthesia Other Findings   Reproductive/Obstetrics negative OB ROS                             Anesthesia Physical Anesthesia Plan  ASA: 2  Anesthesia Plan: General   Post-op Pain Management: Tylenol PO (pre-op)*   Induction: Intravenous  PONV Risk Score and Plan: 3 and Ondansetron, Dexamethasone, Droperidol, Midazolam and Treatment may vary due to age or medical condition  Airway Management Planned: Oral ETT  Additional Equipment:   Intra-op Plan:   Post-operative Plan: Extubation in OR  Informed Consent: I have reviewed the patients History and Physical, chart, labs and discussed the procedure including the risks, benefits and alternatives for the proposed anesthesia with the patient or authorized representative who has indicated his/her understanding and acceptance.     Dental advisory given  Plan Discussed with: CRNA and Surgeon  Anesthesia Plan Comments:        Anesthesia Quick Evaluation

## 2023-05-15 ENCOUNTER — Ambulatory Visit (HOSPITAL_BASED_OUTPATIENT_CLINIC_OR_DEPARTMENT_OTHER): Payer: BC Managed Care – PPO | Admitting: Anesthesiology

## 2023-05-15 ENCOUNTER — Other Ambulatory Visit: Payer: Self-pay

## 2023-05-15 ENCOUNTER — Encounter (HOSPITAL_BASED_OUTPATIENT_CLINIC_OR_DEPARTMENT_OTHER): Payer: Self-pay | Admitting: Plastic Surgery

## 2023-05-15 ENCOUNTER — Ambulatory Visit (HOSPITAL_BASED_OUTPATIENT_CLINIC_OR_DEPARTMENT_OTHER)
Admission: RE | Admit: 2023-05-15 | Discharge: 2023-05-15 | Disposition: A | Discharge status: Home / Self Care | Payer: BC Managed Care – PPO | Source: Ambulatory Visit | Attending: Plastic Surgery | Admitting: Plastic Surgery

## 2023-05-15 ENCOUNTER — Encounter (HOSPITAL_BASED_OUTPATIENT_CLINIC_OR_DEPARTMENT_OTHER)
Admission: RE | Disposition: A | Discharge status: Home / Self Care | Payer: Self-pay | Source: Ambulatory Visit | Attending: Plastic Surgery

## 2023-05-15 DIAGNOSIS — N62 Hypertrophy of breast: Secondary | ICD-10-CM | POA: Insufficient documentation

## 2023-05-15 DIAGNOSIS — Z01818 Encounter for other preprocedural examination: Secondary | ICD-10-CM

## 2023-05-15 DIAGNOSIS — Z7985 Long-term (current) use of injectable non-insulin antidiabetic drugs: Secondary | ICD-10-CM | POA: Insufficient documentation

## 2023-05-15 DIAGNOSIS — N6021 Fibroadenosis of right breast: Secondary | ICD-10-CM | POA: Diagnosis not present

## 2023-05-15 DIAGNOSIS — N6022 Fibroadenosis of left breast: Secondary | ICD-10-CM | POA: Insufficient documentation

## 2023-05-15 DIAGNOSIS — E039 Hypothyroidism, unspecified: Secondary | ICD-10-CM | POA: Insufficient documentation

## 2023-05-15 HISTORY — PX: BREAST REDUCTION SURGERY: SHX8

## 2023-05-15 HISTORY — DX: Hypothyroidism, unspecified: E03.9

## 2023-05-15 LAB — POCT PREGNANCY, URINE: Preg Test, Ur: NEGATIVE

## 2023-05-15 SURGERY — MAMMOPLASTY, REDUCTION
Anesthesia: General | Site: Breast | Laterality: Bilateral

## 2023-05-15 MED ORDER — HYDROMORPHONE HCL 1 MG/ML IJ SOLN
INTRAMUSCULAR | Status: AC
  Filled 2023-05-15: qty 0.5

## 2023-05-15 MED ORDER — HYDROMORPHONE HCL 1 MG/ML IJ SOLN
INTRAMUSCULAR | Status: DC | PRN
Start: 2023-05-15 — End: 2023-05-15
  Administered 2023-05-15: .5 mg via INTRAVENOUS

## 2023-05-15 MED ORDER — LIDOCAINE HCL (CARDIAC) PF 100 MG/5ML IV SOSY
PREFILLED_SYRINGE | INTRAVENOUS | Status: DC | PRN
  Administered 2023-05-15: 100 mg via INTRAVENOUS

## 2023-05-15 MED ORDER — FENTANYL CITRATE (PF) 100 MCG/2ML IJ SOLN
INTRAMUSCULAR | Status: AC
  Filled 2023-05-15: qty 2

## 2023-05-15 MED ORDER — PROPOFOL 10 MG/ML IV BOLUS
INTRAVENOUS | Status: AC
  Filled 2023-05-15: qty 20

## 2023-05-15 MED ORDER — ROCURONIUM BROMIDE 10 MG/ML (PF) SYRINGE
PREFILLED_SYRINGE | INTRAVENOUS | Status: AC
  Filled 2023-05-15: qty 10

## 2023-05-15 MED ORDER — CHLORHEXIDINE GLUCONATE CLOTH 2 % EX PADS: 6.0000 | MEDICATED_PAD | Freq: Once | CUTANEOUS | Status: DC

## 2023-05-15 MED ORDER — 0.9 % SODIUM CHLORIDE (POUR BTL) OPTIME
TOPICAL | Status: DC | PRN
  Administered 2023-05-15: 1000 mL

## 2023-05-15 MED ORDER — LACTATED RINGERS IV SOLN: INTRAVENOUS | Status: DC

## 2023-05-15 MED ORDER — BUPIVACAINE LIPOSOME 1.3 % IJ SUSP
INTRAMUSCULAR | Status: DC | PRN
Start: 2023-05-15 — End: 2023-05-15
  Administered 2023-05-15: 20 mL

## 2023-05-15 MED ORDER — LIDOCAINE-EPINEPHRINE 1 %-1:100000 IJ SOLN
INTRAMUSCULAR | Status: AC
  Filled 2023-05-15: qty 1

## 2023-05-15 MED ORDER — ONDANSETRON HCL 4 MG/2ML IJ SOLN
INTRAMUSCULAR | Status: AC
  Filled 2023-05-15: qty 2

## 2023-05-15 MED ORDER — SUGAMMADEX SODIUM 200 MG/2ML IV SOLN
INTRAVENOUS | Status: DC | PRN
Start: 2023-05-15 — End: 2023-05-15
  Administered 2023-05-15: 200 mg via INTRAVENOUS

## 2023-05-15 MED ORDER — OXYCODONE HCL 5 MG PO TABS
ORAL_TABLET | ORAL | Status: AC
  Filled 2023-05-15: qty 1

## 2023-05-15 MED ORDER — ALBUMIN HUMAN 5 % IV SOLN
INTRAVENOUS | Status: DC | PRN
Start: 2023-05-15 — End: 2023-05-15

## 2023-05-15 MED ORDER — MIDAZOLAM HCL 2 MG/2ML IJ SOLN
INTRAMUSCULAR | Status: AC
  Filled 2023-05-15: qty 2

## 2023-05-15 MED ORDER — ONDANSETRON HCL 4 MG/2ML IJ SOLN
INTRAMUSCULAR | Status: DC | PRN
  Administered 2023-05-15: 4 mg via INTRAVENOUS

## 2023-05-15 MED ORDER — LIDOCAINE 2% (20 MG/ML) 5 ML SYRINGE
INTRAMUSCULAR | Status: AC
  Filled 2023-05-15: qty 5

## 2023-05-15 MED ORDER — OXYCODONE HCL 5 MG/5ML PO SOLN: 5.0000 mg | Freq: Once | ORAL | Status: AC | PRN

## 2023-05-15 MED ORDER — ACETAMINOPHEN 500 MG PO TABS
1000.0000 mg | ORAL_TABLET | Freq: Once | ORAL | Status: AC
  Administered 2023-05-15: 1000 mg via ORAL

## 2023-05-15 MED ORDER — CEFAZOLIN SODIUM-DEXTROSE 2-4 GM/100ML-% IV SOLN
2.0000 g | INTRAVENOUS | Status: AC
  Administered 2023-05-15 (×2): 2 g via INTRAVENOUS

## 2023-05-15 MED ORDER — BUPIVACAINE-EPINEPHRINE (PF) 0.25% -1:200000 IJ SOLN
INTRAMUSCULAR | Status: AC
  Filled 2023-05-15: qty 30

## 2023-05-15 MED ORDER — PROPOFOL 10 MG/ML IV BOLUS
INTRAVENOUS | Status: DC | PRN
  Administered 2023-05-15 (×2): 200 mg via INTRAVENOUS

## 2023-05-15 MED ORDER — BUPIVACAINE HCL (PF) 0.25 % IJ SOLN
INTRAMUSCULAR | Status: AC
  Filled 2023-05-15: qty 30

## 2023-05-15 MED ORDER — DEXAMETHASONE SODIUM PHOSPHATE 4 MG/ML IJ SOLN
INTRAMUSCULAR | Status: DC | PRN
  Administered 2023-05-15: 5 mg via INTRAVENOUS

## 2023-05-15 MED ORDER — KETOROLAC TROMETHAMINE 30 MG/ML IJ SOLN: 30.0000 mg | Freq: Once | INTRAMUSCULAR | Status: DC | PRN

## 2023-05-15 MED ORDER — FENTANYL CITRATE (PF) 100 MCG/2ML IJ SOLN
INTRAMUSCULAR | Status: DC | PRN
  Administered 2023-05-15: 50 ug via INTRAVENOUS
  Administered 2023-05-15: 100 ug via INTRAVENOUS
  Administered 2023-05-15: 50 ug via INTRAVENOUS

## 2023-05-15 MED ORDER — ONDANSETRON HCL 4 MG/2ML IJ SOLN
4.0000 mg | Freq: Once | INTRAMUSCULAR | Status: AC | PRN
  Administered 2023-05-15: 4 mg via INTRAVENOUS

## 2023-05-15 MED ORDER — ACETAMINOPHEN 500 MG PO TABS
ORAL_TABLET | ORAL | Status: AC
  Filled 2023-05-15: qty 2

## 2023-05-15 MED ORDER — PHENYLEPHRINE HCL (PRESSORS) 10 MG/ML IV SOLN
INTRAVENOUS | Status: DC | PRN
Start: 2023-05-15 — End: 2023-05-15
  Administered 2023-05-15 (×5): 160 ug via INTRAVENOUS

## 2023-05-15 MED ORDER — DEXMEDETOMIDINE HCL IN NACL 80 MCG/20ML IV SOLN
INTRAVENOUS | Status: DC | PRN
Start: 2023-05-15 — End: 2023-05-15
  Administered 2023-05-15: 12 ug via INTRAVENOUS

## 2023-05-15 MED ORDER — CEFAZOLIN SODIUM-DEXTROSE 2-4 GM/100ML-% IV SOLN
INTRAVENOUS | Status: AC
  Filled 2023-05-15: qty 100

## 2023-05-15 MED ORDER — DEXAMETHASONE SODIUM PHOSPHATE 10 MG/ML IJ SOLN
INTRAMUSCULAR | Status: AC
  Filled 2023-05-15: qty 1

## 2023-05-15 MED ORDER — DROPERIDOL 2.5 MG/ML IJ SOLN
INTRAMUSCULAR | Status: DC | PRN
Start: 2023-05-15 — End: 2023-05-15
  Administered 2023-05-15: .625 mg via INTRAVENOUS

## 2023-05-15 MED ORDER — BUPIVACAINE HCL (PF) 0.25 % IJ SOLN
INTRAMUSCULAR | Status: DC | PRN
  Administered 2023-05-15: 20 mL

## 2023-05-15 MED ORDER — ROCURONIUM BROMIDE 100 MG/10ML IV SOLN
INTRAVENOUS | Status: DC | PRN
  Administered 2023-05-15: 80 mg via INTRAVENOUS
  Administered 2023-05-15: 20 mg via INTRAVENOUS

## 2023-05-15 MED ORDER — BUPIVACAINE LIPOSOME 1.3 % IJ SUSP
INTRAMUSCULAR | Status: AC
  Filled 2023-05-15: qty 20

## 2023-05-15 MED ORDER — HYDROMORPHONE HCL 1 MG/ML IJ SOLN
0.2500 mg | INTRAMUSCULAR | Status: DC | PRN
  Administered 2023-05-15: 0.25 mg via INTRAVENOUS

## 2023-05-15 MED ORDER — MIDAZOLAM HCL 5 MG/5ML IJ SOLN
INTRAMUSCULAR | Status: DC | PRN
  Administered 2023-05-15: 2 mg via INTRAVENOUS

## 2023-05-15 MED ORDER — OXYCODONE HCL 5 MG PO TABS
5.0000 mg | ORAL_TABLET | Freq: Once | ORAL | Status: AC | PRN
  Administered 2023-05-15: 5 mg via ORAL

## 2023-05-15 SURGICAL SUPPLY — 57 items
ADH SKN CLS APL DERMABOND .7 (GAUZE/BANDAGES/DRESSINGS) ×5
BINDER BREAST 3XL (GAUZE/BANDAGES/DRESSINGS) IMPLANT
BINDER BREAST LRG (GAUZE/BANDAGES/DRESSINGS) IMPLANT
BINDER BREAST MEDIUM (GAUZE/BANDAGES/DRESSINGS) IMPLANT
BINDER BREAST XLRG (GAUZE/BANDAGES/DRESSINGS) IMPLANT
BINDER BREAST XXLRG (GAUZE/BANDAGES/DRESSINGS) IMPLANT
BIOPATCH RED 1 DISK 7.0 (GAUZE/BANDAGES/DRESSINGS) ×2 IMPLANT
BLADE SURG 10 STRL SS (BLADE) ×4 IMPLANT
BLADE SURG 15 STRL LF DISP TIS (BLADE) ×1 IMPLANT
BLADE SURG 15 STRL SS (BLADE) ×2
CANISTER SUCT 1200ML W/VALVE (MISCELLANEOUS) ×1 IMPLANT
DERMABOND ADVANCED .7 DNX12 (GAUZE/BANDAGES/DRESSINGS) ×2 IMPLANT
DRAIN CHANNEL 19F RND (DRAIN) ×2 IMPLANT
DRAPE IMP U-DRAPE 54X76 (DRAPES) IMPLANT
DRAPE UTILITY XL STRL (DRAPES) ×1 IMPLANT
DRSG TEGADERM 4X4.75 (GAUZE/BANDAGES/DRESSINGS) ×2 IMPLANT
ELECT BLADE 4.0 EZ CLEAN MEGAD (MISCELLANEOUS) ×1
ELECT REM PT RETURN 9FT ADLT (ELECTROSURGICAL) ×3
ELECTRODE BLDE 4.0 EZ CLN MEGD (MISCELLANEOUS) ×1 IMPLANT
ELECTRODE REM PT RTRN 9FT ADLT (ELECTROSURGICAL) ×2 IMPLANT
EVACUATOR SILICONE 100CC (DRAIN) ×2 IMPLANT
GAUZE PAD ABD 8X10 STRL (GAUZE/BANDAGES/DRESSINGS) ×2 IMPLANT
GAUZE SPONGE 2X2 STRL 8-PLY (GAUZE/BANDAGES/DRESSINGS) ×2 IMPLANT
GLOVE BIO SURGEON STRL SZ 6.5 (GLOVE) IMPLANT
GLOVE BIO SURGEON STRL SZ7.5 (GLOVE) ×1 IMPLANT
GLOVE BIO SURGEON STRL SZ8 (GLOVE) ×1 IMPLANT
GLOVE BIOGEL PI IND STRL 7.0 (GLOVE) IMPLANT
GLOVE BIOGEL PI IND STRL 8 (GLOVE) ×1 IMPLANT
GOWN STRL REUS W/ TWL LRG LVL3 (GOWN DISPOSABLE) IMPLANT
GOWN STRL REUS W/TWL LRG LVL3 (GOWN DISPOSABLE)
GOWN STRL REUS W/TWL XL LVL3 (GOWN DISPOSABLE) ×1 IMPLANT
HEMOSTAT ARISTA ABSORB 3G PWDR (HEMOSTASIS) IMPLANT
HIBICLENS CHG 4% 4OZ BTL (MISCELLANEOUS) ×1 IMPLANT
MARKER SKIN DUAL TIP RULER LAB (MISCELLANEOUS) ×1 IMPLANT
NDL HYPO 25X1 1.5 SAFETY (NEEDLE) IMPLANT
NEEDLE HYPO 25X1 1.5 SAFETY (NEEDLE) ×1
NS IRRIG 1000ML POUR BTL (IV SOLUTION) ×1 IMPLANT
PACK BASIN DAY SURGERY FS (CUSTOM PROCEDURE TRAY) ×1 IMPLANT
PACK UNIVERSAL I (CUSTOM PROCEDURE TRAY) ×1 IMPLANT
PENCIL SMOKE EVACUATOR (MISCELLANEOUS) ×2 IMPLANT
PIN SAFETY STERILE (MISCELLANEOUS) IMPLANT
SLEEVE SCD COMPRESS KNEE MED (STOCKING) ×1 IMPLANT
SPONGE T-LAP 18X18 ~~LOC~~+RFID (SPONGE) ×3 IMPLANT
STAPLER VISISTAT 35W (STAPLE) ×1 IMPLANT
SUT MNCRL AB 3-0 PS2 27 (SUTURE) ×4 IMPLANT
SUT MNCRL AB 4-0 PS2 18 (SUTURE) ×4 IMPLANT
SUT MON AB 2-0 CT1 36 (SUTURE) ×1 IMPLANT
SUT MON AB 5-0 PS2 18 (SUTURE) IMPLANT
SUT SILK 2 0 SH (SUTURE) ×2 IMPLANT
SUT VIC AB 3-0 SH 27 (SUTURE) ×1
SUT VIC AB 3-0 SH 27X BRD (SUTURE) IMPLANT
SYR BULB IRRIG 60ML STRL (SYRINGE) ×1 IMPLANT
TOWEL GREEN STERILE FF (TOWEL DISPOSABLE) ×2 IMPLANT
TRAY DSU PREP LF (CUSTOM PROCEDURE TRAY) ×1 IMPLANT
TUBE CONNECTING 20X1/4 (TUBING) ×1 IMPLANT
UNDERPAD 30X36 HEAVY ABSORB (UNDERPADS AND DIAPERS) ×2 IMPLANT
YANKAUER SUCT BULB TIP NO VENT (SUCTIONS) ×1 IMPLANT

## 2023-05-15 NOTE — Discharge Instructions (Addendum)
Activity As tolerated. NO showers for 24 hours. Keep wrap on breasts until then. After showering, put wrap back on, this is important for compression. NO driving for 24 hours or when taking pain medication or if you are unable to safely react to traffic. No heavy activities  Take Pain medication (Oxycodone) as needed for severe pain. Otherwise, you can use ibuprofen or tylenol as needed. Avoid more than 3,000 mg of tylenol in 24 hours.  You do not need an antibiotic post-operatively unless this was discussed with the provider at your pre-op appointment.  Diet: Regular. Drink plenty of fluids and eat healthy (high protein, low carbs), Try to optimize your nutrition with plenty of fruits and vegetables to improve healing. Protein shakes are a good option.  Wound Care: Keep dressing clean & dry. You may change bandages after showering if you continue to notice some drainage. You can reuse bandages if they are not dirty/soiled. Mild wound drainage is common after breast reduction surgery and should not be cause for alarm.  Monitor JP drain output.  Special Instructions: Call Doctor if any unusual problems occur such as pain, excessive Bleeding, unrelieved Nausea/vomiting, Fever &/or chills  Follow-up appointment: Previously scheduled.   Post Anesthesia Home Care Instructions  Activity: Get plenty of rest for the remainder of the day. A responsible individual must stay with you for 24 hours following the procedure.  For the next 24 hours, DO NOT: -Drive a car -Advertising copywriter -Drink alcoholic beverages -Take any medication unless instructed by your physician -Make any legal decisions or sign important papers.  Meals: Start with liquid foods such as gelatin or soup. Progress to regular foods as tolerated. Avoid greasy, spicy, heavy foods. If nausea and/or vomiting occur, drink only clear liquids until the nausea and/or vomiting subsides. Call your physician if vomiting  continues.  Special Instructions/Symptoms: Your throat may feel dry or sore from the anesthesia or the breathing tube placed in your throat during surgery. If this causes discomfort, gargle with warm salt water. The discomfort should disappear within 24 hours.  If you had a scopolamine patch placed behind your ear for the management of post- operative nausea and/or vomiting:  1. The medication in the patch is effective for 72 hours, after which it should be removed.  Wrap patch in a tissue and discard in the trash. Wash hands thoroughly with soap and water. 2. You may remove the patch earlier than 72 hours if you experience unpleasant side effects which may include dry mouth, dizziness or visual disturbances. 3. Avoid touching the patch. Wash your hands with soap and water after contact with the patch.     About my Jackson-Pratt Bulb Drain  What is a Jackson-Pratt bulb? A Jackson-Pratt is a soft, round device used to collect drainage. It is connected to a long, thin drainage catheter, which is held in place by one or two small stiches near your surgical incision site. When the bulb is squeezed, it forms a vacuum, forcing the drainage to empty into the bulb.  Emptying the Jackson-Pratt bulb- To empty the bulb: 1. Release the plug on the top of the bulb. 2. Pour the bulb's contents into a measuring container which your nurse will provide. 3. Record the time emptied and amount of drainage. Empty the drain(s) as often as your     doctor or nurse recommends.  Date                  Time  Amount (Drain 1)                 Amount (Drain  2)  _____________________________________________________________________  _____________________________________________________________________  _____________________________________________________________________  _____________________________________________________________________  _____________________________________________________________________  _____________________________________________________________________  _____________________________________________________________________  _____________________________________________________________________  Squeezing the Jackson-Pratt Bulb- To squeeze the bulb: 1. Make sure the plug at the top of the bulb is open. 2. Squeeze the bulb tightly in your fist. You will hear air squeezing from the bulb. 3. Replace the plug while the bulb is squeezed. 4. Use a safety pin to attach the bulb to your clothing. This will keep the catheter from     pulling at the bulb insertion site.  When to call your doctor- Call your doctor if: Drain site becomes red, swollen or hot. You have a fever greater than 101 degrees F. There is oozing at the drain site. Drain falls out (apply a guaze bandage over the drain hole and secure it with tape). Drainage increases daily not related to activity patterns. (You will usually have more drainage when you are active than when you are resting.) Drainage has a bad odor.   Information for Discharge Teaching: EXPAREL (bupivacaine liposome injectable suspension)   Pain relief is important to your recovery. The goal is to control your pain so you can move easier and return to your normal activities as soon as possible after your procedure. Your physician may use several types of medicines to manage pain, swelling, and more.  Your surgeon or anesthesiologist gave you EXPAREL(bupivacaine) to help control your pain after surgery.  EXPAREL is a local anesthetic designed to release slowly over an extended  period of time to provide pain relief by numbing the tissue around the surgical site. EXPAREL is designed to release pain medication over time and can control pain for up to 72 hours. Depending on how you respond to EXPAREL, you may require less pain medication during your recovery. EXPAREL can help reduce or eliminate the need for opioids during the first few days after surgery when pain relief is needed the most. EXPAREL is not an opioid and is not addictive. It does not cause sleepiness or sedation.   Important! A teal colored band has been placed on your arm with the date, time and amount of EXPAREL you have received. Please leave this armband in place for the full 96 hours following administration, and then you may remove the band. If you return to the hospital for any reason within 96 hours following the administration of EXPAREL, the armband provides important information that your health care providers to know, and alerts them that you have received this anesthetic.    Possible side effects of EXPAREL: Temporary loss of sensation or ability to move in the area where medication was injected. Nausea, vomiting, constipation Rarely, numbness and tingling in your mouth or lips, lightheadedness, or anxiety may occur. Call your doctor right away if you think you may be experiencing any of these sensations, or if you have other questions regarding possible side effects.  Follow all other discharge instructions given to you by your surgeon or nurse. Eat a healthy diet and drink plenty of water or other fluids.  Last dose of tylenol given at 710am

## 2023-05-15 NOTE — Transfer of Care (Signed)
Immediate Anesthesia Transfer of Care Note  Patient: Regina Carr  Procedure(s) Performed: MAMMARY REDUCTION  (BREAST) (Bilateral: Breast)  Patient Location: PACU  Anesthesia Type:General  Level of Consciousness: awake, drowsy, and patient cooperative  Airway & Oxygen Therapy: Patient Spontanous Breathing and Patient connected to face mask oxygen  Post-op Assessment: Report given to RN and Post -op Vital signs reviewed and stable  Post vital signs: Reviewed and stable  Last Vitals:  Vitals Value Taken Time  BP 124/81 05/15/23 1151  Temp    Pulse 97 05/15/23 1153  Resp 18 05/15/23 1153  SpO2 100 % 05/15/23 1153  Vitals shown include unfiled device data.  Last Pain:  Vitals:   05/15/23 5643  TempSrc: Temporal  PainSc: 0-No pain         Complications: No notable events documented.

## 2023-05-15 NOTE — Anesthesia Postprocedure Evaluation (Signed)
Anesthesia Post Note  Patient: Regina Carr  Procedure(s) Performed: MAMMARY REDUCTION  (BREAST) (Bilateral: Breast)     Patient location during evaluation: PACU Anesthesia Type: General Level of consciousness: awake and alert Pain management: pain level controlled Vital Signs Assessment: post-procedure vital signs reviewed and stable Respiratory status: spontaneous breathing, nonlabored ventilation, respiratory function stable and patient connected to nasal cannula oxygen Cardiovascular status: blood pressure returned to baseline and stable Postop Assessment: no apparent nausea or vomiting Anesthetic complications: no  No notable events documented.  Last Vitals:  Vitals:   05/15/23 1245 05/15/23 1302  BP: (!) 148/79 (!) 147/75  Pulse: 84 94  Resp: 15 16  Temp:  (!) 36.1 C  SpO2: 99% 99%    Last Pain:  Vitals:   05/15/23 1302  TempSrc:   PainSc: 6                  Lyrique Hakim S

## 2023-05-15 NOTE — Progress Notes (Signed)
RN noted right breast to be firmer than left on initial assessment at 1205, no output on right JP to be noted, Jesse Fall RN called OR to speak with Dr Ladona Ridgel regarding this finding. Dr Ladona Ridgel came to see pt at bedside at 1236 and evaluated both breast surgical sites. No new orders.

## 2023-05-15 NOTE — Anesthesia Procedure Notes (Signed)
Procedure Name: Intubation Date/Time: 05/15/2023 7:48 AM  Performed by: Karen Kitchens, CRNAPre-anesthesia Checklist: Patient identified, Emergency Drugs available, Suction available and Patient being monitored Patient Re-evaluated:Patient Re-evaluated prior to induction Oxygen Delivery Method: Circle system utilized Preoxygenation: Pre-oxygenation with 100% oxygen Induction Type: IV induction Ventilation: Mask ventilation without difficulty Laryngoscope Size: Mac and 4 Grade View: Grade I Tube type: Oral Tube size: 7.0 mm Number of attempts: 1 Airway Equipment and Method: Stylet and Oral airway Placement Confirmation: ETT inserted through vocal cords under direct vision, positive ETCO2, breath sounds checked- equal and bilateral and CO2 detector Secured at: 23 cm Tube secured with: Tape Dental Injury: Teeth and Oropharynx as per pre-operative assessment

## 2023-05-15 NOTE — Interval H&P Note (Signed)
History and Physical Interval Note: No change in exam or indication for surgery All questions answered to her satisfaction. Marked for a bilateral breast reduction with her assistance. Will proceed at her request  05/15/2023 7:11 AM  Regina Carr  has presented today for surgery, with the diagnosis of hypertrophy of breasat.  The various methods of treatment have been discussed with the patient and family. After consideration of risks, benefits and other options for treatment, the patient has consented to  Procedure(s): MAMMARY REDUCTION  (BREAST) (Bilateral) as a surgical intervention.  The patient's history has been reviewed, patient examined, no change in status, stable for surgery.  I have reviewed the patient's chart and labs.  Questions were answered to the patient's satisfaction.     Santiago Glad

## 2023-05-15 NOTE — OR Nursing (Signed)
Noted right side JP bulb filling with steady drip. Approximately 25cc dark blood in right JP bulb. Dr. Ladona Ridgel called to operating room to evaluate.

## 2023-05-15 NOTE — Op Note (Signed)
DATE OF OPERATION: 05/15/2023  LOCATION: [facility] operating Room  PREOPERATIVE DIAGNOSIS: Symptomatic macromastia  POSTOPERATIVE DIAGNOSIS: Same  PROCEDURE: Bilateral breast reduction  SURGEON: Loren Racer, MD  ASSISTANT: Matt Scheeler  EBL: 200 cc  CONDITION: Stable  COMPLICATIONS: None  INDICATION: The patient, Regina Carr, is a 22 y.o. female born on 24-Aug-2001, is here for treatment upper back and neck pain secondary to large breast size.   PROCEDURE DETAILS:  The patient was seen prior to surgery and marked.  The IV antibiotics were given. The patient was taken to the operating room and given a general anesthetic. A standard time out was performed and all information was confirmed by those in the room. SCDs were placed.   The chest was prepped and draped in the usual sterile manner.  The nipple and areolar complexes were marked with a 42 mm cookie cutter and an 8 cm based inferior pedicle was outlined on the breast.  The right breast was addressed first.  Laparotomy tape was placed at the base of the breast and the pedicle was de-epithelialized sharply.  The electrocautery was then used to dissect the pedicle to the chest wall.  Once the pedicle was developed the electrocautery was used to resect the medial lateral and superior triangles of breast tissue.  The superior skin flap was developed and thinned with the electrocautery.  The breast tissue removed constituted the bulk of the breast reduction.  The weight of the tissue was 1075 g.  The wound was irrigated with warm saline and hemostasis obtained with the electrocautery.  A 19 [place] Blake drain was placed behind the pedicle and brought out through a separate stab incision.  The T point was approximated with a single 2-0 Monocryl suture.  The skin edges were tailor tacked in place with skin clips.  The dermis was closed with interrupted and running 3-0 Monocryl sutures and the skin with edges were closed with a running  4-0 Monocryl subcuticular stitch.  Attention was then turned to the left breast where similar procedure was performed.  The pedicle was de-epithelialized sharply and then dissected to the chest wall with the electrocautery.  After developing the pedicle of the medial lateral and superior triangles of breast tissue were excised with electrocautery and the skin flap was developed and thinned with the electrocautery.  The breast tissue removed constituted the bulk of the reduction.  This was 1060 g.  All breast tissue was sent to pathology for routine examination.  The surgical site was inspected for bleeding and hemostasis achieved with electrocautery.  The surgical wound was irrigated with warm normal saline.  A 19 [place] Blake drain was placed behind the pedicle and brought out through a separate stab incision.  The T point was closed within interrupted 2-0 Monocryl suture and the skin edges tailor tacked in place with skin clips.  The dermis was closed with interrupted and running 3-0 Monocryl sutures and the skin edges were closed with a running 4-0 Monocryl subcuticular stitch.  Dermabond was applied to all incisions and the patient was placed in a compressive garment.  As she was being moved to the transport stretcher she was noted to have an increased in the amount of blood in her right drain.  I was called and elected to reopen the wound to ensure there is no ongoing bleeding.  The chest was reprepped and draped sterilely in the lateral portion of the inframammary incision was opened.  There was a small arterial actively bleeding.  This was cauterized and suture-ligated.  The remainder of the surgical site was then irrigated and inspected any evidence of bleeding was cauterized.  Arista hemostatic agent was placed in the wound and a new 19 [place] Blake drain was placed.  The skin edges were again closed at the level of the dermis with 3-0 Monocryl in the skin at with a 4-0 Monocryl.  The incision was sealed  with Dermabond and the patient was awakened from anesthesia and transferred to the recovery room in good condition all instrument needle and sponge counts from both procedures were reported as correct. The patient was allowed to wake up and taken to recovery room in stable condition at the end of the case. The family was notified at the end of the case.   The advanced practice practitioner (APP) assisted throughout the case.  The APP was essential in retraction and counter traction when needed to make the case progress smoothly.  This retraction and assistance made it possible to see the tissue plans for the procedure.  The assistance was needed for blood control, tissue re-approximation and assisted with closure of the incision site.

## 2023-05-18 ENCOUNTER — Ambulatory Visit (INDEPENDENT_AMBULATORY_CARE_PROVIDER_SITE_OTHER): Payer: BC Managed Care – PPO | Admitting: Plastic Surgery

## 2023-05-18 ENCOUNTER — Telehealth: Payer: Self-pay

## 2023-05-18 ENCOUNTER — Encounter (HOSPITAL_BASED_OUTPATIENT_CLINIC_OR_DEPARTMENT_OTHER): Payer: Self-pay | Admitting: Plastic Surgery

## 2023-05-18 VITALS — BP 136/87

## 2023-05-18 DIAGNOSIS — Z9889 Other specified postprocedural states: Secondary | ICD-10-CM

## 2023-05-18 LAB — SURGICAL PATHOLOGY

## 2023-05-18 NOTE — Telephone Encounter (Signed)
I spoke with the patient about receiving disability paperwork and the fee associated with filling out forms. The patient advised me she would pay on Monday, 05/27/2023, at her follow-up appointment.

## 2023-05-18 NOTE — Progress Notes (Signed)
[name] returns today 3 days postop from bilateral breast reduction.  She is uncomfortable and has some burning pain near the drains but is overall doing well.  She states that she has dumped her drains twice per day but has been just a small amount each time.  The drain output is thin and serosanguineous.  Incisions are intact.  Nipples are warm and well-perfused and she has feeling in both.  The breasts are appropriately firm consistent with swelling but nothing that feels like a hematoma.  Drains were removed today without difficulty.  She is encouraged to slowly increase her activity, use Motrin for discomfort, and begin showering.  She will keep her scheduled follow-up appointment in 1 week.

## 2023-05-25 ENCOUNTER — Encounter: Payer: Self-pay | Admitting: Surgical

## 2023-05-25 ENCOUNTER — Ambulatory Visit (INDEPENDENT_AMBULATORY_CARE_PROVIDER_SITE_OTHER): Payer: BC Managed Care – PPO | Admitting: Surgical

## 2023-05-25 ENCOUNTER — Encounter: Payer: BC Managed Care – PPO | Admitting: Surgical

## 2023-05-25 VITALS — BP 121/84 | HR 71

## 2023-05-25 DIAGNOSIS — Z9889 Other specified postprocedural states: Secondary | ICD-10-CM

## 2023-05-25 DIAGNOSIS — Z719 Counseling, unspecified: Secondary | ICD-10-CM

## 2023-05-25 MED ORDER — ONDANSETRON HCL 4 MG PO TABS: 4.0000 mg | ORAL_TABLET | Freq: Three times a day (TID) | ORAL | 0 refills | Status: DC | PRN

## 2023-05-25 NOTE — Progress Notes (Addendum)
Patient is a 22 year old female here for follow-up after bilateral breast reduction with Dr. Ladona Ridgel on 05/15/2023.  She is 10 days postop.  Patient presents today with her aunt.  She reports she feels as if her breasts are "heavy".  She also reports that she has been constipated since surgery, she has had 1 bowel movement.  She has not tried any MiraLAX or any other over-the-counter medications to assist with this.  She is not having any infectious symptoms.  She reports she is having a lot of discomfort, particularly when sitting up for extended periods of time.  Chaperone present on exam Bilateral NAC's are viable, bilateral breast incisions are intact. There is no erythema or cellulitic changes noted. She does have some swelling of lateral breast, I do not appreciate any significant subcutaneous fluid collections, however there is some lateral breast fullness noted on exam, right greater than left.  She does have some tenderness with palpation.  Recommend continuing with compressive garment 24/7 until 6 weeks post-op,  avoiding strenuous activity/heavy lifting until 6 weeks post-op  Patient reports that she works from home, she does not feel as if she will be able to return to work for a few more weeks as she is having significant pain, she reports significant pain when sitting in her office chair for extended periods of time.  She is requesting to remain out of work for a few more weeks to assist with recovery.  I discussed with the patient that if she is continuing to have pain, we can have her remain out of work until approximately 06/11/2023, but would like to reevaluate at her next appointment.  Recommend starting either milk of magnesia or MiraLAX today, discussed with patient if she does not have improvement in the next 24 to 48 hours, she may need to use an enema.  All the patient's questions were answered to their content. Recommend calling with any questions or concerns.

## 2023-06-04 ENCOUNTER — Ambulatory Visit: Payer: BC Managed Care – PPO | Admitting: Surgical

## 2023-06-04 ENCOUNTER — Encounter: Payer: Self-pay | Admitting: Surgical

## 2023-06-04 VITALS — BP 145/80 | HR 85 | Ht 61.0 in | Wt 195.4 lb

## 2023-06-04 DIAGNOSIS — Z9889 Other specified postprocedural states: Secondary | ICD-10-CM

## 2023-06-04 DIAGNOSIS — N62 Hypertrophy of breast: Secondary | ICD-10-CM

## 2023-06-04 NOTE — Progress Notes (Signed)
Patient is a  23 y.o.-year-old female status post bilateral breast reduction with Dr.  Ladona Ridgel. Patient is 3 weeks postop.  At her last appointment patient reported ongoing tenderness, also reported her breast felt "heavy".  She reports today that she is still having some discomfort in her bilateral breast, heaviness and tenderness.  She reports that she is scheduled to return to work on 06/11/2023.  She is not having any fevers.  She does report that she feels colder than normal  Chaperone present on exam Bilateral NAC's are viable, bilateral breast incisions are intact. There is no erythema or cellulitic changes noted. No subcutaneous fluid collections noted with palpation. She does have firmness of bilateral breast, I do not appreciate any signs of infection or concern.  The incisions are all intact and appear to be healing well.  She does have Dermabond still present on the incisions.  A/P:  Discussed with patient that I think it is reasonable for her to return to work on 06/11/2023, she works from home and does not have any lifting requirements.  I think is reasonable for her to stay out until then due to the ongoing tenderness, but do not feel as if she needs to stay out any longer than that.  Recommend continuing with compressive garment 24/7 until 6 weeks post-op,  avoiding strenuous activity/heavy lifting until 6 weeks post-op  Recommend following up in 3 weeks for reevaluation All the patient's questions were answered to their content. Recommend calling with any questions or concerns.  Pictures were obtained of the patient and placed in the chart with the patient's or guardian's permission.

## 2023-06-05 ENCOUNTER — Telehealth: Payer: Self-pay

## 2023-06-05 NOTE — Telephone Encounter (Signed)
Faxed disability paperwork on 06/05/23 to [employer] with a success of 11 pages.

## 2023-06-22 ENCOUNTER — Encounter: Payer: Self-pay | Admitting: Surgical

## 2023-06-22 ENCOUNTER — Ambulatory Visit (INDEPENDENT_AMBULATORY_CARE_PROVIDER_SITE_OTHER): Payer: BC Managed Care – PPO | Admitting: Surgical

## 2023-06-22 DIAGNOSIS — Z9889 Other specified postprocedural states: Secondary | ICD-10-CM

## 2023-06-22 DIAGNOSIS — N62 Hypertrophy of breast: Secondary | ICD-10-CM

## 2023-06-22 NOTE — Addendum Note (Signed)
Addended by: Drema Dallas K on: 06/22/2023 12:03 PM   Modules accepted: Orders

## 2023-06-22 NOTE — Progress Notes (Signed)
Patient is a  22 y.o.-year-old female status post bilateral breast reduction with Dr.  Ladona Ridgel. Patient is 5 weeks postop.  Chaperone present on exam Bilateral NAC's are viable, bilateral breast incisions are intact. There is no erythema or cellulitic changes noted. No subcutaneous fluid collections noted with palpation.  A/P:  Recommend continuing with compressive garment 24/7 until 6 weeks post-op,  avoiding strenuous activity/heavy lifting until 6 weeks post-op  Recommend following up a needed. All the patient's questions were answered to their content. Recommend calling with any questions or concerns.  No signs of infections.

## 2023-06-25 ENCOUNTER — Ambulatory Visit: Payer: BC Managed Care – PPO | Admitting: Surgical

## 2023-08-15 ENCOUNTER — Emergency Department (HOSPITAL_BASED_OUTPATIENT_CLINIC_OR_DEPARTMENT_OTHER): Payer: Self-pay

## 2023-08-15 ENCOUNTER — Emergency Department (HOSPITAL_BASED_OUTPATIENT_CLINIC_OR_DEPARTMENT_OTHER)
Admission: EM | Admit: 2023-08-15 | Discharge: 2023-08-15 | Disposition: A | Discharge status: Home / Self Care | Payer: Self-pay | Attending: Emergency Medicine | Admitting: Emergency Medicine

## 2023-08-15 ENCOUNTER — Encounter (HOSPITAL_BASED_OUTPATIENT_CLINIC_OR_DEPARTMENT_OTHER): Payer: Self-pay

## 2023-08-15 DIAGNOSIS — Z794 Long term (current) use of insulin: Secondary | ICD-10-CM | POA: Insufficient documentation

## 2023-08-15 DIAGNOSIS — J181 Lobar pneumonia, unspecified organism: Secondary | ICD-10-CM | POA: Insufficient documentation

## 2023-08-15 DIAGNOSIS — Z20822 Contact with and (suspected) exposure to covid-19: Secondary | ICD-10-CM | POA: Insufficient documentation

## 2023-08-15 DIAGNOSIS — J189 Pneumonia, unspecified organism: Secondary | ICD-10-CM

## 2023-08-15 DIAGNOSIS — Z79899 Other long term (current) drug therapy: Secondary | ICD-10-CM | POA: Insufficient documentation

## 2023-08-15 DIAGNOSIS — E039 Hypothyroidism, unspecified: Secondary | ICD-10-CM | POA: Insufficient documentation

## 2023-08-15 LAB — RESP PANEL BY RT-PCR (RSV, FLU A&B, COVID)  RVPGX2
Influenza A by PCR: NEGATIVE
Influenza B by PCR: NEGATIVE
Resp Syncytial Virus by PCR: NEGATIVE
SARS Coronavirus 2 by RT PCR: NEGATIVE

## 2023-08-15 LAB — GROUP A STREP BY PCR: Group A Strep by PCR: NOT DETECTED

## 2023-08-15 MED ORDER — DOXYCYCLINE HYCLATE 100 MG PO CAPS: 100.0000 mg | ORAL_CAPSULE | Freq: Two times a day (BID) | ORAL | 0 refills | Status: AC

## 2023-08-15 MED ORDER — DOXYCYCLINE HYCLATE 100 MG PO TABS
100.0000 mg | ORAL_TABLET | Freq: Once | ORAL | Status: AC
  Administered 2023-08-15: 100 mg via ORAL
  Filled 2023-08-15: qty 1

## 2023-08-15 MED ORDER — GUAIFENESIN-CODEINE 100-10 MG/5ML PO SOLN
5.0000 mL | Freq: Three times a day (TID) | ORAL | 0 refills | Status: AC | PRN
Start: 2023-08-15 — End: ?

## 2023-08-15 NOTE — ED Provider Notes (Signed)
[facility] Provider Note   CSN: [id number] Arrival date & time: 08/15/23  4166     History  Chief Complaint  Patient presents with   URI    Regina Carr is a 22 y.o. female.  Pt is a 22 yo female with pmhx significant for hypothyroidism.  Pt said she's been sneezing and coughing for 2 weeks.  She's been coughing so much that her throat is sore.  Sputum is yellow.  No fevers.  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        Home Medications Prior to Admission medications   Medication Sig Start Date End Date Taking? Authorizing Provider  doxycycline (VIBRAMYCIN) 100 MG capsule Take 1 capsule (100 mg total) by mouth 2 (two) times daily. 08/15/23  Yes Jacalyn Lefevre, MD  guaiFENesin-codeine 100-10 MG/5ML syrup Take 5 mLs by mouth 3 (three) times daily as needed for cough. 08/15/23  Yes Jacalyn Lefevre, MD  levothyroxine (SYNTHROID) 25 MCG tablet Take 25 mcg by mouth daily. 01/15/23   [provider]  Semaglutide-Weight Management (WEGOVY) 0.25 MG/0.5ML SOAJ Inject into the skin. 01/12/23   [provider]      Allergies    Patient has no known allergies.    Review of Systems   Review of Systems  HENT:  Positive for rhinorrhea.   Respiratory:  Positive for cough.   All other systems reviewed and are negative.   Physical Exam Updated Vital Signs BP 136/76   Pulse 83   Temp 98.4 F (36.9 C) (Oral)   Resp 20   Ht 5\' 1"  (1.549 m)   Wt 88.5 kg   LMP 07/26/2023   SpO2 98%   BMI 36.84 kg/m  Physical Exam Vitals and nursing note reviewed.  Constitutional:      Appearance: Normal appearance.  HENT:     Head: Normocephalic and atraumatic.     Right Ear: External ear normal.     Nose: Congestion present.     Mouth/Throat:     Mouth: Mucous membranes are dry.     Pharynx: Oropharynx is clear.  Eyes:     Extraocular Movements: Extraocular movements intact.     Conjunctiva/sclera:  Conjunctivae normal.     Pupils: Pupils are equal, round, and reactive to light.  Cardiovascular:     Rate and Rhythm: Normal rate and regular rhythm.     Pulses: Normal pulses.     Heart sounds: Normal heart sounds.  Pulmonary:     Effort: Pulmonary effort is normal.     Breath sounds: Normal breath sounds.  Abdominal:     General: Abdomen is flat. Bowel sounds are normal.     Palpations: Abdomen is soft.  Musculoskeletal:        General: Normal range of motion.     Cervical back: Normal range of motion and neck supple.  Skin:    General: Skin is warm.     Capillary Refill: Capillary refill takes less than 2 seconds.  Neurological:     General: No focal deficit present.     Mental Status: She is alert and oriented to person, place, and time.  Psychiatric:        Mood and Affect: Mood normal.        Behavior: Behavior normal.        Thought Content: Thought content normal.        Judgment: Judgment normal.  ED Results / Procedures / Treatments   Labs (all labs ordered are listed, but only abnormal results are displayed) Labs Reviewed  RESP PANEL BY RT-PCR (RSV, FLU A&B, COVID)  RVPGX2  GROUP A STREP BY PCR    EKG None  Radiology DG Chest 2 View Result Date: 08/15/2023 CLINICAL DATA:  22 year old female with cough. Sickness for the past 2 weeks. Hoarseness. EXAM: CHEST - 2 VIEW COMPARISON:  Chest radiographs 10/27/2017. FINDINGS: PA and lateral views 1048 hours. Normal lung volumes and mediastinal contours. Subtle asymmetric right lower lung opacity on the PA view, subtle increased density over the lower spine on the lateral. No pleural effusion, and otherwise the lungs appear clear. No osseous abnormality identified.  Negative visible bowel gas. IMPRESSION: Subtle evidence of right lower lobe bronchopneumonia. No pleural effusion. Electronically Signed   By: Odessa Fleming M.D.   On: 08/15/2023 10:31    Procedures Procedures    Medications Ordered in ED Medications   doxycycline (VIBRA-TABS) tablet 100 mg (has no administration in time range)    ED Course/ Medical Decision Making/ A&P                                 Medical Decision Making Amount and/or Complexity of Data Reviewed Radiology: ordered.  Risk OTC drugs. Prescription drug management.   This patient presents to the ED for concern of uri, this involves an extensive number of treatment options, and is a complaint that carries with it a high risk of complications and morbidity.  The differential diagnosis includes covid/flu/rsv, bronchitis   Co morbidities that complicate the patient evaluation  hypothyroidism   Additional history obtained:  Additional history obtained from epic chart review  Lab Tests:  I Ordered, and personally interpreted labs.  The pertinent results include:  covid/flu/rsv neg, strep neg   Imaging Studies ordered:  I ordered imaging studies including cxr  I independently visualized and interpreted imaging which showed  Subtle evidence of right lower lobe bronchopneumonia. No pleural  effusion.   I agree with the radiologist interpretation   Medicines ordered and prescription drug management:  I ordered medication including doxy  for pna  Reevaluation of the patient after these medicines showed that the patient improved I have reviewed the patients home medicines and have made adjustments as needed   Test Considered:  cxr   Critical Interventions:  abx   Problem List / ED Course:  Lll pna:  pt looks well.  Vitals are nl.  She is stable for d/c home with abx.  Return if worse.    Reevaluation:  After the interventions noted above, I reevaluated the patient and found that they have :improved   Social Determinants of Health:  Lives at home   Dispostion:  After consideration of the diagnostic results and the patients response to treatment, I feel that the patent would benefit from discharge with outpatient f/u.           Final Clinical Impression(s) / ED Diagnoses Final diagnoses:  Community acquired pneumonia of left lower lobe of lung    Rx / DC Orders ED Discharge Orders          Ordered    doxycycline (VIBRAMYCIN) 100 MG capsule  2 times daily        08/15/23 1039    guaiFENesin-codeine 100-10 MG/5ML syrup  3 times daily PRN        08/15/23 1056  Jacalyn Lefevre, MD 08/15/23 1057

## 2023-08-15 NOTE — ED Triage Notes (Signed)
Pt has been sick for the past 2 weeks. Sneezing, painful dry coughing, difficulty talking/hoarseness. No difficulty breathing.   Robina Ade, RN

## 2023-09-30 ENCOUNTER — Other Ambulatory Visit: Payer: Self-pay

## 2023-09-30 ENCOUNTER — Emergency Department (HOSPITAL_BASED_OUTPATIENT_CLINIC_OR_DEPARTMENT_OTHER)
Admission: EM | Admit: 2023-09-30 | Discharge: 2023-10-01 | Disposition: A | Discharge status: Home / Self Care | Payer: Self-pay | Attending: Emergency Medicine | Admitting: Emergency Medicine

## 2023-09-30 ENCOUNTER — Encounter (HOSPITAL_BASED_OUTPATIENT_CLINIC_OR_DEPARTMENT_OTHER): Payer: Self-pay | Admitting: Emergency Medicine

## 2023-09-30 DIAGNOSIS — R112 Nausea with vomiting, unspecified: Secondary | ICD-10-CM | POA: Insufficient documentation

## 2023-09-30 DIAGNOSIS — R197 Diarrhea, unspecified: Secondary | ICD-10-CM | POA: Insufficient documentation

## 2023-09-30 DIAGNOSIS — R1013 Epigastric pain: Secondary | ICD-10-CM | POA: Insufficient documentation

## 2023-09-30 MED ORDER — SODIUM CHLORIDE 0.9 % IV BOLUS
1000.0000 mL | Freq: Once | INTRAVENOUS | Status: AC
  Administered 2023-10-01: 1000 mL via INTRAVENOUS

## 2023-09-30 MED ORDER — ONDANSETRON HCL 4 MG/2ML IJ SOLN
4.0000 mg | Freq: Once | INTRAMUSCULAR | Status: AC
  Administered 2023-10-01: 4 mg via INTRAVENOUS
  Filled 2023-09-30: qty 2

## 2023-09-30 NOTE — ED Triage Notes (Signed)
C/o n/v/d since last night. Denies fever. Generalized ABD pain.

## 2023-09-30 NOTE — ED Provider Notes (Signed)
[facility] Provider Note   CSN: [id number] Arrival date & time: 09/30/23  1835     History {Add pertinent medical, surgical, social history, OB history to HPI:1} Chief Complaint  Patient presents with   Emesis    Regina Carr is a 23 y.o. female.  Patient presents to the ED with concern for "stomach bug".  States she has had nausea, vomiting, diarrhea since 1 AM.  Estimates she has had 6 or 7 loose nonbloody stools today and 3-4 episodes of vomiting with diffuse crampy abdominal pain worse in the epigastrium.  No fever.  No travel or sick contacts.  No recent antibiotic use.  No recent new food intake.  Is prescribed Wegovy but has not used it for at least a month.  No previous abdominal surgeries.  States unable to eat or drink anything today due to nausea and vomiting.  Last episode of vomiting about 2 hours ago.  The history is provided by the patient.  Emesis Associated symptoms: abdominal pain and diarrhea   Associated symptoms: no arthralgias, no cough, no fever, no headaches and no myalgias        Home Medications Prior to Admission medications   Medication Sig Start Date End Date Taking? Authorizing Provider  doxycycline (VIBRAMYCIN) 100 MG capsule Take 1 capsule (100 mg total) by mouth 2 (two) times daily. 08/15/23   Jacalyn Lefevre, MD  guaiFENesin-codeine 100-10 MG/5ML syrup Take 5 mLs by mouth 3 (three) times daily as needed for cough. 08/15/23   Jacalyn Lefevre, MD  levothyroxine (SYNTHROID) 25 MCG tablet Take 25 mcg by mouth daily. 01/15/23   [provider]  Semaglutide-Weight Management (WEGOVY) 0.25 MG/0.5ML SOAJ Inject into the skin. 01/12/23   [provider]      Allergies    Patient has no known allergies.    Review of Systems   Review of Systems  Constitutional:  Positive for activity change and appetite change. Negative for fever.  HENT:  Negative for congestion.   Respiratory:   Negative for cough, chest tightness and shortness of breath.   Cardiovascular:  Negative for chest pain.  Gastrointestinal:  Positive for abdominal pain, diarrhea, nausea and vomiting.  Genitourinary:  Negative for dysuria and hematuria.  Musculoskeletal:  Negative for arthralgias and myalgias.  Skin:  Negative for rash.  Neurological:  Negative for dizziness, weakness and headaches.   all other systems are negative except as noted in the HPI and PMH.    Physical Exam Updated Vital Signs BP (!) 153/88   Pulse (!) 101   Temp 98.9 F (37.2 C)   Resp 14   Ht 5\' 2"  (1.575 m)   Wt 88.5 kg   SpO2 99%   BMI 35.67 kg/m  Physical Exam Vitals and nursing note reviewed.  Constitutional:      General: She is not in acute distress.    Appearance: She is well-developed. She is obese.  HENT:     Head: Normocephalic and atraumatic.     Mouth/Throat:     Pharynx: No oropharyngeal exudate.  Eyes:     Conjunctiva/sclera: Conjunctivae normal.     Pupils: Pupils are equal, round, and reactive to light.  Neck:     Comments: No meningismus. Cardiovascular:     Rate and Rhythm: Regular rhythm. Tachycardia present.     Heart sounds: Normal heart sounds. No murmur heard. Pulmonary:     Effort: Pulmonary effort is normal. No respiratory distress.  Breath sounds: Normal breath sounds.  Abdominal:     Palpations: Abdomen is soft.     Tenderness: There is abdominal tenderness. There is no guarding or rebound.     Comments: Diffuse tenderness, soft, worsening epigastrium, no guarding or rebound  Musculoskeletal:        General: No tenderness. Normal range of motion.     Cervical back: Normal range of motion and neck supple.  Skin:    General: Skin is warm.  Neurological:     Mental Status: She is alert and oriented to person, place, and time.     Cranial Nerves: No cranial nerve deficit.     Motor: No abnormal muscle tone.     Coordination: Coordination normal.     Comments: No ataxia on  finger to nose bilaterally. No pronator drift. 5/5 strength throughout. CN 2-12 intact.Equal grip strength. Sensation intact.   Psychiatric:        Behavior: Behavior normal.     ED Results / Procedures / Treatments   Labs (all labs ordered are listed, but only abnormal results are displayed) Labs Reviewed  CBC WITH DIFFERENTIAL/PLATELET  COMPREHENSIVE METABOLIC PANEL  LIPASE, BLOOD  URINALYSIS, ROUTINE W REFLEX MICROSCOPIC  PREGNANCY, URINE    EKG None  Radiology No results found.  Procedures Procedures  {Document cardiac monitor, telemetry assessment procedure when appropriate:1}  Medications Ordered in ED Medications  sodium chloride 0.9 % bolus 1,000 mL (has no administration in time range)  ondansetron (ZOFRAN) injection 4 mg (has no administration in time range)    ED Course/ Medical Decision Making/ A&P   {   Click here for ABCD2, HEART and other calculatorsREFRESH Note before signing :1}                              Medical Decision Making Amount and/or Complexity of Data Reviewed Labs: ordered. Decision-making details documented in ED Course. Radiology: ordered and independent interpretation performed. Decision-making details documented in ED Course. ECG/medicine tests: ordered and independent interpretation performed. Decision-making details documented in ED Course.  Risk Prescription drug management.   24 hours of nausea, vomiting, diarrhea.  No fever.  Abdomen soft without peritoneal signs.  Suspect likely viral gastroenteritis.  Will hydrate, treat symptoms.  Check labs and urinalysis.  Low suspicion for acute surgical pathology such as appendicitis or cholecystitis.  {Document critical care time when appropriate:1} {Document review of labs and clinical decision tools ie heart score, Chads2Vasc2 etc:1}  {Document your independent review of radiology images, and any outside records:1} {Document your discussion with family members, caretakers, and  with consultants:1} {Document social determinants of health affecting pt's care:1} {Document your decision making why or why not admission, treatments were needed:1} Final Clinical Impression(s) / ED Diagnoses Final diagnoses:  None    Rx / DC Orders ED Discharge Orders     None

## 2023-10-01 ENCOUNTER — Emergency Department (HOSPITAL_BASED_OUTPATIENT_CLINIC_OR_DEPARTMENT_OTHER): Payer: Self-pay

## 2023-10-01 ENCOUNTER — Encounter (HOSPITAL_BASED_OUTPATIENT_CLINIC_OR_DEPARTMENT_OTHER): Payer: Self-pay

## 2023-10-01 LAB — CBC WITH DIFFERENTIAL/PLATELET
Abs Immature Granulocytes: 0.03 10*3/uL (ref 0.00–0.07)
Basophils Absolute: 0 10*3/uL (ref 0.0–0.1)
Basophils Relative: 0 %
Eosinophils Absolute: 0 10*3/uL (ref 0.0–0.5)
Eosinophils Relative: 0 %
HCT: 31.7 % — ABNORMAL LOW (ref 36.0–46.0)
Hemoglobin: 9.9 g/dL — ABNORMAL LOW (ref 12.0–15.0)
Immature Granulocytes: 1 %
Lymphocytes Relative: 20 %
Lymphs Abs: 1.3 10*3/uL (ref 0.7–4.0)
MCH: 22.7 pg — ABNORMAL LOW (ref 26.0–34.0)
MCHC: 31.2 g/dL (ref 30.0–36.0)
MCV: 72.7 fL — ABNORMAL LOW (ref 80.0–100.0)
Monocytes Absolute: 0.7 10*3/uL (ref 0.1–1.0)
Monocytes Relative: 10 %
Neutro Abs: 4.5 10*3/uL (ref 1.7–7.7)
Neutrophils Relative %: 69 %
Platelets: 420 10*3/uL — ABNORMAL HIGH (ref 150–400)
RBC: 4.36 MIL/uL (ref 3.87–5.11)
RDW: 20.2 % — ABNORMAL HIGH (ref 11.5–15.5)
WBC: 6.5 10*3/uL (ref 4.0–10.5)
nRBC: 0 % (ref 0.0–0.2)

## 2023-10-01 LAB — COMPREHENSIVE METABOLIC PANEL
ALT: 16 U/L (ref 0–44)
AST: 20 U/L (ref 15–41)
Albumin: 3.9 g/dL (ref 3.5–5.0)
Alkaline Phosphatase: 113 U/L (ref 38–126)
Anion gap: 8 (ref 5–15)
BUN: 14 mg/dL (ref 6–20)
CO2: 23 mmol/L (ref 22–32)
Calcium: 9 mg/dL (ref 8.9–10.3)
Chloride: 104 mmol/L (ref 98–111)
Creatinine, Ser: 0.66 mg/dL (ref 0.44–1.00)
GFR, Estimated: >60 mL/min (ref >60)
Glucose, Bld: 103 mg/dL — ABNORMAL HIGH (ref 70–99)
Potassium: 3.4 mmol/L — ABNORMAL LOW (ref 3.5–5.1)
Sodium: 135 mmol/L (ref 135–145)
Total Bilirubin: 0.6 mg/dL (ref 0.0–1.2)
Total Protein: 8 g/dL (ref 6.5–8.1)

## 2023-10-01 LAB — URINALYSIS, ROUTINE W REFLEX MICROSCOPIC
Bilirubin Urine: NEGATIVE
Glucose, UA: NEGATIVE mg/dL
Hgb urine dipstick: NEGATIVE
Ketones, ur: 15 mg/dL — AB
Leukocytes,Ua: NEGATIVE
Nitrite: NEGATIVE
Protein, ur: 30 mg/dL — AB
Specific Gravity, Urine: >=1.03 (ref 1.005–1.030)
pH: 5.5 (ref 5.0–8.0)

## 2023-10-01 LAB — URINALYSIS, MICROSCOPIC (REFLEX): Bacteria, UA: NONE SEEN

## 2023-10-01 LAB — LIPASE, BLOOD: Lipase: 22 U/L (ref 11–51)

## 2023-10-01 LAB — PREGNANCY, URINE: Preg Test, Ur: NEGATIVE

## 2023-10-01 MED ORDER — IOHEXOL 300 MG/ML  SOLN
100.0000 mL | Freq: Once | INTRAMUSCULAR | Status: AC | PRN
  Administered 2023-10-01: 100 mL via INTRAVENOUS

## 2023-10-01 MED ORDER — ONDANSETRON 4 MG PO TBDP: 4.0000 mg | ORAL_TABLET | Freq: Three times a day (TID) | ORAL | 0 refills | Status: AC | PRN

## 2023-10-01 MED ORDER — AMOXICILLIN-POT CLAVULANATE 875-125 MG PO TABS: 1.0000 | ORAL_TABLET | Freq: Two times a day (BID) | ORAL | 0 refills | Status: AC

## 2023-10-01 NOTE — Discharge Instructions (Addendum)
Start with a clear liquid diet and advance slowly as tolerated.  As we discussed, your appendix appears slightly enlarged on CT scan and may represent early appendicitis.  However viral illness seems more likely at this time and the surgeon agrees.  Take the antibiotics as prescribed.  Follow-up with the general surgeon in the office. If you develop worsening right-sided lower abdominal pain, fever, vomiting, not able to eat or drink you should return to the ED for further evaluation for possible appendicitis.
